# Patient Record
Sex: Female | Born: 1975 | Race: White | Hispanic: Yes | Marital: Single | Smoking: Never smoker
Health system: Southern US, Community
[De-identification: ages and names within clinical notes are randomized; demographics above are authoritative.]

## PROBLEM LIST (undated history)

## (undated) DIAGNOSIS — K219 Gastro-esophageal reflux disease without esophagitis: Secondary | ICD-10-CM

## (undated) DIAGNOSIS — M545 Low back pain, unspecified: Secondary | ICD-10-CM

## (undated) DIAGNOSIS — B159 Hepatitis A without hepatic coma: Secondary | ICD-10-CM

## (undated) DIAGNOSIS — G8929 Other chronic pain: Secondary | ICD-10-CM

## (undated) DIAGNOSIS — I8290 Acute embolism and thrombosis of unspecified vein: Secondary | ICD-10-CM

## (undated) DIAGNOSIS — Z8709 Personal history of other diseases of the respiratory system: Secondary | ICD-10-CM

## (undated) DIAGNOSIS — G43909 Migraine, unspecified, not intractable, without status migrainosus: Secondary | ICD-10-CM

## (undated) DIAGNOSIS — J45909 Unspecified asthma, uncomplicated: Secondary | ICD-10-CM

## (undated) HISTORY — PX: NO PAST SURGERIES: SHX2092

## (undated) HISTORY — DX: Unspecified asthma, uncomplicated: J45.909

---
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
## 2011-11-14 ENCOUNTER — Ambulatory Visit (INDEPENDENT_AMBULATORY_CARE_PROVIDER_SITE_OTHER): Payer: BC Managed Care – PPO | Admitting: Family Medicine

## 2011-11-14 ENCOUNTER — Ambulatory Visit: Payer: BC Managed Care – PPO

## 2011-11-14 DIAGNOSIS — R1110 Vomiting, unspecified: Secondary | ICD-10-CM

## 2011-11-14 DIAGNOSIS — R1084 Generalized abdominal pain: Secondary | ICD-10-CM

## 2011-11-14 DIAGNOSIS — R110 Nausea: Secondary | ICD-10-CM

## 2011-11-14 LAB — POCT CBC
Granulocyte percent: 68.9 %G (ref 37–80)
HCT, POC: 39.4 % (ref 37.7–47.9)
MCHC: 32 g/dL (ref 31.8–35.4)
MPV: 9 fL (ref 0–99.8)
POC Granulocyte: 5.2 (ref 2–6.9)
POC LYMPH PERCENT: 23.6 %L (ref 10–50)
POC MID %: 7.5 %M (ref 0–12)
RDW, POC: 13.5 %

## 2011-11-14 LAB — POCT URINALYSIS DIPSTICK
Bilirubin, UA: NEGATIVE
Glucose, UA: NEGATIVE
Ketones, UA: NEGATIVE
Leukocytes, UA: NEGATIVE
Nitrite, UA: NEGATIVE
pH, UA: 5

## 2011-11-14 LAB — POCT URINE PREGNANCY: Preg Test, Ur: NEGATIVE

## 2011-11-14 LAB — POCT UA - MICROSCOPIC ONLY: Yeast, UA: NEGATIVE

## 2011-11-14 NOTE — Progress Notes (Signed)
Patient Name: [name] Date of Birth: Nov 14, 1975 Medical Record Number: [id number] Gender: female Date of Encounter: 11/14/2011  History of Present Illness:  [name] is a 36 y.o. very pleasant female patient who presents with the following:  [name] has been having waxing and waning abdominal pain and bloating for the last week.  She has actually had these symptoms on occasion over the last couple of years.  The pain tends to occur after eating.  She has noted that milk or very cold foods make her worse.  She has worked on avoiding these foods and seemed to do better- however her symptoms have returned despite these measures recently.   [name] notes that she will have more severe post- prandial pain for 45 minutes to an hour, and then her pain fades away over the next 2 or 3 hours.   Sometimes she will have nausea and vomiting, but vomiting does not resolve her symptoms.  No current constipation or diarrhea.  No fever. No urinary or vaginal symptoms.    LMP- currently No surgical history History of Hep A as a child- around 84 years old.   Her mother had a history of gallstones and had a cholecystectomy in her 30s.   She has no children   There is no problem list on file for this patient.  No past medical history on file. No past surgical history on file. History  Substance Use Topics  . Smoking status: Never Smoker   . Smokeless tobacco: Not on file  . Alcohol Use: Not on file   No family history on file. No Known Allergies  Medication list has been reviewed and updated.  Review of Systems: As per HPI- otherwise negative.  Physical Examination: Filed Vitals:   11/14/11 1237  BP: 107/68  Pulse: 60  Temp: 98.7 F (37.1 C)  TempSrc: Oral  Resp: 16  Height: 5' 2.5" (1.588 m)  Weight: 158 lb (71.668 kg)    Body mass index is 28.44 kg/(m^2).  GEN: WDWN, NAD, Non-toxic, A & O x 3, overweight HEENT: Atraumatic, Normocephalic. Neck  supple. No masses, No LAD.  TM wnl, oropharynx wnl, PEERL Ears and Nose: No external deformity. CV: RRR, No M/G/R. No JVD. No thrill. No extra heart sounds. PULM: CTA B, no wheezes, crackles, rhonchi. No retractions. No resp. distress. No accessory muscle use. ABD: S, NT, ND, +BS. No rebound. No HSM.  Murphy's: no pain, but does note "pressure."  Overall abdominal exam is benign EXTR: No c/c/e NEURO Normal gait.  PSYCH: Normally interactive. Conversant. Not depressed or anxious appearing.  Calm demeanor.   Results for orders placed in visit on 11/14/11  POCT CBC      Component Value Range   WBC 7.5  4.6 - 10.2 (K/uL)   Lymph, poc 1.8  0.6 - 3.4    POC LYMPH PERCENT 23.6  10 - 50 (%L)   MID (cbc) 0.6  0 - 0.9    POC MID % 7.5  0 - 12 (%M)   POC Granulocyte 5.2  2 - 6.9    Granulocyte percent 68.9  37 - 80 (%G)   RBC 4.74  4.04 - 5.48 (M/uL)   Hemoglobin 12.6  12.2 - 16.2 (g/dL)   HCT, POC 03.4  74.2 - 47.9 (%)   MCV 83.2  80 - 97 (fL)   MCH, POC 26.6 (*) 27 - 31.2 (pg)   MCHC 32.0  31.8 - 35.4 (g/dL)   RDW, POC 13.5  Platelet Count, POC 303  142 - 424 (K/uL)   MPV 9.0  0 - 99.8 (fL)  POCT UA - MICROSCOPIC ONLY      Component Value Range   WBC, Ur, HPF, POC 0-2     RBC, urine, microscopic tntc     Bacteria, U Microscopic neg     Mucus, UA neg     Epithelial cells, urine per micros 0-2     Crystals, Ur, HPF, POC neg     Casts, Ur, LPF, POC neg     Yeast, UA neg    POCT URINALYSIS DIPSTICK      Component Value Range   Color, UA amber     Clarity, UA clear     Glucose, UA neg     Bilirubin, UA neg     Ketones, UA neg     Spec Grav, UA 1.020     Blood, UA large     pH, UA 5.0     Protein, UA trace     Urobilinogen, UA 0.2     Nitrite, UA neg     Leukocytes, UA Negative    POCT URINE PREGNANCY      Component Value Range   Preg Test, Ur Negative     [facility] reading (PRIMARY) by  Dr. Patsy Lager. Negative for obstructive pattern  ABDOMEN - 1 VIEW  Comparison:  None.  Findings: No dilated loops of large or small bowel. No pathologic calcifications. Rounded vascular calcification in left lower pelvis. No acute abnormality.  IMPRESSION: Normal abdominal radiograph.  Clinically significant discrepancy from primary report, if provided: None  Assessment and Plan: 1. Abdominal pain  POCT CBC, POCT UA - Microscopic Only, POCT urinalysis dipstick, POCT urine pregnancy, Comprehensive metabolic panel, Amylase, Lipase, DG Abd 1 View, US Abdomen Complete  2. Nausea  US Abdomen Complete  3. Vomiting  US Abdomen Complete   Suspect that [name] is having gallbladder colic.  Will arrange for her to have an ultrasound in the next couple of days.  If she gets worse please call or seek care right away.  Avoid fatty foods

## 2011-11-15 ENCOUNTER — Ambulatory Visit (INDEPENDENT_AMBULATORY_CARE_PROVIDER_SITE_OTHER): Payer: BC Managed Care – PPO | Admitting: Family Medicine

## 2011-11-15 ENCOUNTER — Encounter (HOSPITAL_COMMUNITY): Payer: Self-pay | Admitting: General Practice

## 2011-11-15 ENCOUNTER — Inpatient Hospital Stay (HOSPITAL_COMMUNITY)
Admission: AD | Admit: 2011-11-15 | Discharge: 2011-11-19 | DRG: 493 | Disposition: A | Discharge status: Home / Self Care | Payer: BC Managed Care – PPO | Source: Ambulatory Visit | Attending: Family Medicine | Admitting: Family Medicine

## 2011-11-15 ENCOUNTER — Ambulatory Visit
Admission: RE | Admit: 2011-11-15 | Discharge: 2011-11-15 | Disposition: A | Discharge status: Home / Self Care | Payer: BC Managed Care – PPO | Source: Ambulatory Visit | Attending: Family Medicine | Admitting: Family Medicine

## 2011-11-15 ENCOUNTER — Encounter: Payer: Self-pay | Admitting: Family Medicine

## 2011-11-15 VITALS — BP 141/85 | HR 77 | Temp 98.8°F | Resp 16 | Ht 61.5 in | Wt 158.0 lb

## 2011-11-15 DIAGNOSIS — R109 Unspecified abdominal pain: Secondary | ICD-10-CM

## 2011-11-15 DIAGNOSIS — J45909 Unspecified asthma, uncomplicated: Secondary | ICD-10-CM | POA: Diagnosis present

## 2011-11-15 DIAGNOSIS — R112 Nausea with vomiting, unspecified: Secondary | ICD-10-CM

## 2011-11-15 DIAGNOSIS — K8021 Calculus of gallbladder without cholecystitis with obstruction: Secondary | ICD-10-CM

## 2011-11-15 DIAGNOSIS — R1110 Vomiting, unspecified: Secondary | ICD-10-CM

## 2011-11-15 DIAGNOSIS — R110 Nausea: Secondary | ICD-10-CM

## 2011-11-15 DIAGNOSIS — R1084 Generalized abdominal pain: Secondary | ICD-10-CM

## 2011-11-15 DIAGNOSIS — E876 Hypokalemia: Secondary | ICD-10-CM | POA: Diagnosis present

## 2011-11-15 DIAGNOSIS — K219 Gastro-esophageal reflux disease without esophagitis: Secondary | ICD-10-CM | POA: Diagnosis present

## 2011-11-15 DIAGNOSIS — Z9049 Acquired absence of other specified parts of digestive tract: Secondary | ICD-10-CM

## 2011-11-15 DIAGNOSIS — K810 Acute cholecystitis: Secondary | ICD-10-CM

## 2011-11-15 DIAGNOSIS — K8070 Calculus of gallbladder and bile duct without cholecystitis without obstruction: Principal | ICD-10-CM | POA: Diagnosis present

## 2011-11-15 DIAGNOSIS — B159 Hepatitis A without hepatic coma: Secondary | ICD-10-CM

## 2011-11-15 DIAGNOSIS — R1011 Right upper quadrant pain: Secondary | ICD-10-CM

## 2011-11-15 HISTORY — DX: Acute embolism and thrombosis of unspecified vein: I82.90

## 2011-11-15 HISTORY — DX: Gastro-esophageal reflux disease without esophagitis: K21.9

## 2011-11-15 HISTORY — DX: Other chronic pain: G89.29

## 2011-11-15 HISTORY — DX: Migraine, unspecified, not intractable, without status migrainosus: G43.909

## 2011-11-15 HISTORY — DX: Personal history of other diseases of the respiratory system: Z87.09

## 2011-11-15 HISTORY — DX: Low back pain, unspecified: M54.50

## 2011-11-15 HISTORY — DX: Low back pain: M54.5

## 2011-11-15 HISTORY — DX: Hepatitis a without hepatic coma: B15.9

## 2011-11-15 LAB — COMPREHENSIVE METABOLIC PANEL
ALT: 496 U/L — ABNORMAL HIGH (ref 0–35)
AST: 479 U/L — ABNORMAL HIGH (ref 0–37)
Albumin: 4.8 g/dL (ref 3.5–5.2)
Alkaline Phosphatase: 316 U/L — ABNORMAL HIGH (ref 39–117)
BUN: 17 mg/dL (ref 6–23)
Calcium: 9.1 mg/dL (ref 8.4–10.5)
Chloride: 106 mEq/L (ref 96–112)
Potassium: 4.4 mEq/L (ref 3.5–5.3)
Sodium: 142 mEq/L (ref 135–145)
Total Protein: 8.2 g/dL (ref 6.0–8.3)

## 2011-11-15 LAB — CBC
HCT: 36.6 % (ref 36.0–46.0)
MCV: 81.5 fL (ref 78.0–100.0)
RBC: 4.49 MIL/uL (ref 3.87–5.11)
RDW: 13.3 % (ref 11.5–15.5)
WBC: 11 10*3/uL — ABNORMAL HIGH (ref 4.0–10.5)

## 2011-11-15 LAB — CREATININE, SERUM
GFR calc Af Amer: >90 mL/min (ref >90)
GFR calc non Af Amer: >90 mL/min (ref >90)

## 2011-11-15 MED ORDER — MORPHINE SULFATE 2 MG/ML IJ SOLN
2.0000 mg | INTRAMUSCULAR | Status: DC | PRN
  Administered 2011-11-15: 2 mg via INTRAVENOUS
  Filled 2011-11-15 (×2): qty 1

## 2011-11-15 MED ORDER — DEXTROSE-NACL 5-0.45 % IV SOLN
INTRAVENOUS | Status: DC
  Administered 2011-11-15 – 2011-11-17 (×4): via INTRAVENOUS

## 2011-11-15 MED ORDER — HYDROMORPHONE HCL 2 MG PO TABS
2.0000 mg | ORAL_TABLET | Freq: Once | ORAL | Status: AC
  Administered 2011-11-15: 2 mg via ORAL
  Filled 2011-11-15: qty 1

## 2011-11-15 MED ORDER — ONDANSETRON HCL 4 MG/2ML IJ SOLN
4.0000 mg | Freq: Four times a day (QID) | INTRAMUSCULAR | Status: DC | PRN
  Filled 2011-11-15: qty 2

## 2011-11-15 MED ORDER — ENOXAPARIN SODIUM 40 MG/0.4ML ~~LOC~~ SOLN
40.0000 mg | SUBCUTANEOUS | Status: DC
  Administered 2011-11-15: 40 mg via SUBCUTANEOUS
  Filled 2011-11-15 (×2): qty 0.4

## 2011-11-15 NOTE — Progress Notes (Signed)
  Patient Name: [name] Date of Birth: 06-06-1976 Medical Record Number: [id number] Gender: female Date of Encounter: 11/15/2011  History of Present Illness:  [name] is a 36 y.o. very pleasant female patient who presents with the following:  Here to recheck her condition and be evaluated for possible direct admission to [facility] for management of a likely CBD stone.  See recent ultrasound result and elevated LFTs.  [name] is currently having pain again for about an hour, but this is the same pain she has been having through the course of her illness.  She has vomited once about an hour after we called her to let her know that she probably needed admission.  She was not sure if this was due to pain or nerves.  She had been able to eat earlier today and has no fever  There is no problem list on file for this patient.  Past Medical History  Diagnosis Date  . Reactive airway disease    No past surgical history on file. History  Substance Use Topics  . Smoking status: Never Smoker   . Smokeless tobacco: Never Used  . Alcohol Use: Yes     occasinallly    Family History  Problem Relation Age of Onset  . Hypertension Mother   . Diabetes Maternal Grandfather    No Known Allergies  Medication list has been reviewed and updated.  Review of Systems: As per HPI- otherwise negative.  Physical Examination: Filed Vitals:   11/15/11 1908  BP: 141/85  Pulse: 77  Temp: 98.8 F (37.1 C)  TempSrc: Oral  Resp: 16  Height: 5' 1.5" (1.562 m)  Weight: 158 lb (71.668 kg)    Body mass index is 29.37 kg/(m^2).  GEN: WDWN, NAD, Non-toxic, A & O x 3 HEENT: Atraumatic, Normocephalic. Neck supple. No masses, No LAD. Ears and Nose: No external deformity. CV: RRR, No M/G/R. No JVD. No thrill. No extra heart sounds. PULM: CTA B, no wheezes, crackles, rhonchi. No retractions. No resp. distress. No accessory muscle use. ABD:  ND, +BS. No rebound. No HSM.  RUQ tenderness,  positive murphy's sign EXTR: No c/c/e NEURO Normal gait.  PSYCH: Normally interactive. Conversant. Not depressed or anxious appearing.  Calm demeanor but appears uncomfortable   Assessment and Plan: 1. Gallstones with biliary obstruction    [name] likely has a CBD stone.  Will admit for supportive care and further treatment (ERCP, cholecystectomy as deemed appropriate).  Appreciate care of FP inpatient team.  Her BF is here to drive her to the hospital

## 2011-11-15 NOTE — H&P (Incomplete)
**Note Peggy-Identified via Obfuscation** [name] is an 36 y.o. female.    Chief Complaint: ***  HPI: ***  Past Medical History  Diagnosis Date  . Reactive airway disease     No past surgical history on file.  Family History  Problem Relation Age of Onset  . Hypertension Mother   . Diabetes Maternal Grandfather    Social History:  reports that she has never smoked. She has never used smokeless tobacco. She reports that she drinks alcohol. She reports that she does not use illicit drugs.  Allergies: No Known Allergies   Results for orders placed in visit on 11/14/11 (from the past 48 hour(s))  COMPREHENSIVE METABOLIC PANEL     Status: Abnormal   Collection Time   11/14/11  1:43 PM      Component Value Range Comment   Sodium 142  135 - 145 (mEq/L)    Potassium 4.4  3.5 - 5.3 (mEq/L)    Chloride 106  96 - 112 (mEq/L)    CO2 26  19 - 32 (mEq/L)    Glucose, Bld 97  70 - 99 (mg/dL)    BUN 17  6 - 23 (mg/dL)    Creat 1.61  0.96 - 1.10 (mg/dL)    Total Bilirubin 0.8  0.3 - 1.2 (mg/dL)    Alkaline Phosphatase 316 (*) 39 - 117 (U/L) Result repeated and verified.   AST 479 (*) 0 - 37 (U/L) Result repeated and verified.   ALT 496 (*) 0 - 35 (U/L)    Total Protein 8.2  6.0 - 8.3 (g/dL)    Albumin 4.8  3.5 - 5.2 (g/dL)    Calcium 9.1  8.4 - 10.5 (mg/dL)   AMYLASE     Status: Normal   Collection Time   11/14/11  1:43 PM      Component Value Range Comment   Amylase 47  0 - 105 (U/L)   LIPASE     Status: Normal   Collection Time   11/14/11  1:43 PM      Component Value Range Comment   Lipase 40  0 - 75 (U/L)   POCT CBC     Status: Abnormal   Collection Time   11/14/11  1:51 PM      Component Value Range Comment   WBC 7.5  4.6 - 10.2 (K/uL)    Lymph, poc 1.8  0.6 - 3.4     POC LYMPH PERCENT 23.6  10 - 50 (%L)    MID (cbc) 0.6  0 - 0.9     POC MID % 7.5  0 - 12 (%M)    POC Granulocyte 5.2  2 - 6.9     Granulocyte percent 68.9  37 - 80 (%G)    RBC 4.74  4.04 - 5.48 (M/uL)    Hemoglobin 12.6  12.2 - 16.2  (g/dL)    HCT, POC 04.5  40.9 - 47.9 (%)    MCV 83.2  80 - 97 (fL)    MCH, POC 26.6 (*) 27 - 31.2 (pg)    MCHC 32.0  31.8 - 35.4 (g/dL)    RDW, POC 81.1      Platelet Count, POC 303  142 - 424 (K/uL)    MPV 9.0  0 - 99.8 (fL)   POCT UA - MICROSCOPIC ONLY     Status: Normal   Collection Time   11/14/11  1:51 PM      Component Value Range Comment   WBC, Ur, HPF, POC  0-2      RBC, urine, microscopic tntc      Bacteria, U Microscopic neg      Mucus, UA neg      Epithelial cells, urine per micros 0-2      Crystals, Ur, HPF, POC neg      Casts, Ur, LPF, POC neg      Yeast, UA neg     POCT URINALYSIS DIPSTICK     Status: Normal   Collection Time   11/14/11  1:51 PM      Component Value Range Comment   Color, UA amber      Clarity, UA clear      Glucose, UA neg      Bilirubin, UA neg      Ketones, UA neg      Spec Grav, UA 1.020      Blood, UA large      pH, UA 5.0      Protein, UA trace      Urobilinogen, UA 0.2      Nitrite, UA neg      Leukocytes, UA Negative     POCT URINE PREGNANCY     Status: Normal   Collection Time   11/14/11  1:51 PM      Component Value Range Comment   Preg Test, Ur Negative      Dg Abd 1 View  11/14/2011  *RADIOLOGY REPORT*  Clinical Data: Epigastric pain  ABDOMEN - 1 VIEW.  IMPRESSION: Normal abdominal radiograph.  Clinically significant discrepancy from primary report, if provided: None  Original Report Authenticated By: Genevive Bi, M.D.   US Abdomen Complete  11/15/2011  *RADIOLOGY REPORT*  Clinical Data:  Epigastric pain, nausea, vomiting and bloating  COMPLETE ABDOMINAL ULTRASOUND  IMPRESSION:  1.  Multiple gallstones,  the largest measuring 2.5 cm.  No present evidence of acute cholecystitis is noted by ultrasound. 2.  Slightly prominent common bile duct.  Cannot exclude distal common bile duct calculus.  Original Report Authenticated By: Juline Patch, M.D.    ROS  There were no vitals taken for this visit. Physical Exam    Assessment/Plan # Choledocholithiasis: Abdominal US concerning for distal common bile duct calculus.  Patient complains of abdominal pain and intermittent nausea/vomiting, but denies any fever, chills. - Admit to FPTS - Make NPO after MD, MIVF D5 1/2 NS @ 150 cc/hr - On call GI physician aware of patient, GI plans to perform ERCP tomorrow AM  #  [name],[name] 11/15/2011, 8:50 PM

## 2011-11-15 NOTE — H&P (Signed)
[name] is an 36 y.o. female.    Chief Complaint: abdominal pain, nausea, vomiting  HPI:   Patient was seen at [facility] today for worsening abdominal pain.  Pain is located mid-epigastric area and radiates to RUQ, LUQ, and upper back.   Patient has had chronic abdominal pain that has been on and off for about one year.  Last weekend, she developed worsening mid-epigastric pain that was worse after drinking alcohol, milk, and very cold foods.  Yesterday, she developed pain, nausea, and vomiting and went to Urgent Care.  Physician ordered an abdominal US which showed dilated CBD and possible stone, and patient returned to [place] today for possible direct admission.  Today, patient complains of worsening pain and was unable to keep down any food.  Denies fevers, chills, GU symptoms or diarrhea.    Past Medical History  Diagnosis Date  . Reactive airway disease, Dx Hepatitis A when she was 36 years old    Past Surgical Hx: None  Family History  Problem Relation Age of Onset  . Hypertension Mother   . Diabetes Maternal Grandfather    Social History:  reports that she has never smoked. She has never used smokeless tobacco. She reports that she drinks alcohol on occasion, one glass on the weekends.  She reports that she does not use illicit drugs.  Allergies: No Known Allergies           Component Value Range   Sodium 142  135 - 145 (mEq/L)   Potassium 4.4  3.5 - 5.3 (mEq/L)   Chloride 106  96 - 112 (mEq/L)   CO2 26  19 - 32 (mEq/L)   Glucose, Bld 97  70 - 99 (mg/dL)   BUN 17  6 - 23 (mg/dL)   Creat 1.61  0.96 - 0.45 (mg/dL)   Total Bilirubin 0.8  0.3 - 1.2 (mg/dL)   Alkaline Phosphatase 316 (*) 39 - 117 (U/L)   AST 479 (*) 0 - 37 (U/L)   ALT 496 (*) 0 - 35 (U/L)   Total Protein 8.2  6.0 - 8.3 (g/dL)   Albumin 4.8  3.5 - 5.2 (g/dL)   Calcium 9.1  8.4 - 40.9 (mg/dL)          Component Value Range   WBC 7.5  4.6 - 10.2 (K/uL)   Lymph, poc 1.8  0.6 - 3.4      POC LYMPH PERCENT 23.6  10 - 50 (%L)   MID (cbc) 0.6  0 - 0.9    POC MID % 7.5  0 - 12 (%M)   POC Granulocyte 5.2  2 - 6.9    Granulocyte percent 68.9  37 - 80 (%G)   RBC 4.74  4.04 - 5.48 (M/uL)   Hemoglobin 12.6  12.2 - 16.2 (g/dL)   HCT, POC 81.1  91.4 - 47.9 (%)   MCV 83.2  80 - 97 (fL)   MCH, POC 26.6 (*) 27 - 31.2 (pg)   MCHC 32.0  31.8 - 35.4 (g/dL)   RDW, POC 78.2     Platelet Count, POC 303  142 - 424 (K/uL)   MPV 9.0  0 - 99.8 (fL)          Component Value Range   WBC, Ur, HPF, POC 0-2     RBC, urine, microscopic tntc     Bacteria, U Microscopic neg     Mucus, UA neg     Epithelial cells, urine per micros  0-2     Crystals, Ur, HPF, POC neg     Casts, Ur, LPF, POC neg     Yeast, UA neg            Component Value Range   Color, UA amber     Clarity, UA clear     Glucose, UA neg     Bilirubin, UA neg     Ketones, UA neg     Spec Grav, UA 1.020     Blood, UA large     pH, UA 5.0     Protein, UA trace     Urobilinogen, UA 0.2     Nitrite, UA neg     Leukocytes, UA Negative            Component Value Range   Preg Test, Ur Negative     Dg Abd 1 View  11/14/2011  *RADIOLOGY REPORT*  Clinical Data: Epigastric pain  ABDOMEN - 1 VIEW.  IMPRESSION: Normal abdominal radiograph.  Clinically significant discrepancy from primary report, if provided: None  Original Report Authenticated By: Genevive Bi, M.D.   US Abdomen Complete  11/15/2011  *RADIOLOGY REPORT*  Clinical Data:  Epigastric pain, nausea, vomiting and bloating  COMPLETE ABDOMINAL ULTRASOUND  IMPRESSION:  1.  Multiple gallstones,  the largest measuring 2.5 cm.  No present evidence of acute cholecystitis is noted by ultrasound. 2.  Slightly prominent common bile duct.  Cannot exclude distal common bile duct calculus.  Original Report Authenticated By: Juline Patch, M.D.   Lipase     Component Value Date/Time   LIPASE 40 11/14/2011 1343    Amylase    Component Value Date/Time   AMYLASE 47 11/14/2011  1343    ROS  Per HPI  Blood pressure 129/81, pulse 69, temperature 98.2 F (36.8 C), temperature source Oral, resp. rate 14, weight 198 lb 10.2 oz (90.1 kg), SpO2 100.00%. Physical Exam  Constitutional:       Mild distress due to pain, she is very pleasant, but very uncomfortable  HENT:  Head: Normocephalic and atraumatic.  Neck: Normal range of motion. Neck supple.  Cardiovascular: Normal rate, regular rhythm and normal heart sounds.   No murmur heard. Respiratory: Effort normal and breath sounds normal. No respiratory distress. She has no wheezes. She has no rales.  GI: Soft.       Non-distended, diminished BS, + RUQ tenderness, no rebound or guarding  Musculoskeletal: Normal range of motion. She exhibits no edema and no tenderness.  Neurological: She is alert.  Skin: Skin is warm. No rash noted.    Assessment/Plan # Choledocholithiasis: Abdominal US concerning for distal common bile duct calculus.  Patient complains of abdominal pain and intermittent nausea/vomiting, but denies any fever, chills. - Admit to FPTS - Make NPO after MD, MIVF D5 1/2 NS @ 150 cc/hr - Case discussed on call GI physician, GI will see patient and likely perform ERCP or MRCP in AM.  Thank you for you assistance. - Follow up CBC, CMET, amylase, lipase in AM  # Abdominal Pain/Nausea/Vomiting: on arrival, patient given Dilaudid 2 mg PO once - Morphine 2 mg q 3 PRN pain - Zofran 4 mg q 6 PRN nausea - MIVF  # Seasonal allergies: stable - Hold PO medications until seen by GI  # FEN/GI: - See above assessment  # DVT PPX: Lovenox SQ daily  # Disposition: pending clinical improvement and GI recommendations.     [name],Yony Roulston 11/15/2011, 9:37 PM

## 2011-11-15 NOTE — Patient Instructions (Signed)
Please go to [facility].  Check in at the ED but DO NOT be seen there.  Your bed # is 5524, and you are being admitted to the family practice service.

## 2011-11-16 ENCOUNTER — Encounter (HOSPITAL_COMMUNITY): Payer: Self-pay | Admitting: Physician Assistant

## 2011-11-16 ENCOUNTER — Other Ambulatory Visit: Payer: Self-pay

## 2011-11-16 DIAGNOSIS — Z9049 Acquired absence of other specified parts of digestive tract: Secondary | ICD-10-CM

## 2011-11-16 DIAGNOSIS — R7402 Elevation of levels of lactic acid dehydrogenase (LDH): Secondary | ICD-10-CM | POA: Diagnosis present

## 2011-11-16 DIAGNOSIS — R112 Nausea with vomiting, unspecified: Secondary | ICD-10-CM

## 2011-11-16 DIAGNOSIS — R7401 Elevation of levels of liver transaminase levels: Secondary | ICD-10-CM

## 2011-11-16 DIAGNOSIS — R1013 Epigastric pain: Secondary | ICD-10-CM

## 2011-11-16 DIAGNOSIS — K8070 Calculus of gallbladder and bile duct without cholecystitis without obstruction: Principal | ICD-10-CM

## 2011-11-16 LAB — COMPREHENSIVE METABOLIC PANEL
ALT: 304 U/L — ABNORMAL HIGH (ref 0–35)
AST: 118 U/L — ABNORMAL HIGH (ref 0–37)
Albumin: 3.5 g/dL (ref 3.5–5.2)
CO2: 25 mEq/L (ref 19–32)
Calcium: 8.6 mg/dL (ref 8.4–10.5)
Chloride: 102 mEq/L (ref 96–112)
Creatinine, Ser: 0.62 mg/dL (ref 0.50–1.10)
GFR calc non Af Amer: >90 mL/min (ref >90)
Sodium: 137 mEq/L (ref 135–145)
Total Bilirubin: 0.7 mg/dL (ref 0.3–1.2)

## 2011-11-16 LAB — CBC
Hemoglobin: 11.3 g/dL — ABNORMAL LOW (ref 12.0–15.0)
MCH: 27.4 pg (ref 26.0–34.0)
MCHC: 33.1 g/dL (ref 30.0–36.0)
MCV: 82.8 fL (ref 78.0–100.0)
RBC: 4.12 MIL/uL (ref 3.87–5.11)

## 2011-11-16 LAB — LIPASE, BLOOD: Lipase: 46 U/L (ref 11–59)

## 2011-11-16 MED ORDER — GLYCERIN (LAXATIVE) 2.1 G RE SUPP
1.0000 | Freq: Once | RECTAL | Status: AC
  Administered 2011-11-16: 1 via RECTAL
  Filled 2011-11-16: qty 1

## 2011-11-16 MED ORDER — ENOXAPARIN SODIUM 40 MG/0.4ML ~~LOC~~ SOLN
40.0000 mg | SUBCUTANEOUS | Status: DC
  Administered 2011-11-16: 40 mg via SUBCUTANEOUS
  Filled 2011-11-16 (×2): qty 0.4

## 2011-11-16 MED ORDER — POTASSIUM CHLORIDE 20 MEQ/15ML (10%) PO LIQD
40.0000 meq | Freq: Once | ORAL | Status: AC
  Administered 2011-11-16: 40 meq via ORAL
  Filled 2011-11-16: qty 30

## 2011-11-16 MED ORDER — POTASSIUM CHLORIDE 10 MEQ/100ML IV SOLN
10.0000 meq | INTRAVENOUS | Status: DC
  Administered 2011-11-16: 10 meq via INTRAVENOUS
  Filled 2011-11-16 (×4): qty 100

## 2011-11-16 NOTE — Progress Notes (Signed)
Family Medicine Teaching Service Daily Progress Note  Patient ID: [name], female   DOB: Oct 02, 1975, 36 y.o.   MRN: [id number] Subjective: Interval History: No overnight issues.  Slept comfortably.  Still feels some discomfort in her upper stomach, but says she feels much better. Denies any other symptoms.  Objective: Vital signs in last 24 hours: Temp:  [97.7 F (36.5 C)-98.8 F (37.1 C)] 97.7 F (36.5 C) (05/03 1914) Pulse Rate:  [56-77] 56  (05/03 0633) Resp:  [14-16] 16  (05/03 0633) BP: (99-141)/(67-85) 99/67 mmHg (05/03 0633) SpO2:  [99 %-100 %] 99 % (05/03 7829) Weight:  [158 lb (71.668 kg)-198 lb 10.2 oz (90.1 kg)] 198 lb 10.2 oz (90.1 kg) (05/02 2129)  Physical Exam  Constitutional: She is oriented to person, place, and time and well-developed, well-nourished, and in no distress.  HENT:  Head: Normocephalic.  Eyes: EOM are normal.  Neck: Normal range of motion.  Cardiovascular: Normal rate, regular rhythm and normal heart sounds.  Exam reveals no gallop and no friction rub.   No murmur heard. Pulmonary/Chest: Effort normal and breath sounds normal. No respiratory distress. She has no wheezes.  Abdominal: Soft. Bowel sounds are normal. She exhibits no mass. There is tenderness. There is no guarding.       Normoactive bowel sounds can be heard throughout. Mild tenderness over right upper quadrant and epigastric area.  Musculoskeletal: Normal range of motion.  Neurological: She is alert and oriented to person, place, and time.  Skin: Skin is warm.   . CMP     Component Value Date/Time   NA 137 11/16/2011 0606   K 3.1* 11/16/2011 0606   CL 102 11/16/2011 0606   CO2 25 11/16/2011 0606   GLUCOSE 118* 11/16/2011 0606   BUN 13 11/16/2011 0606   CREATININE 0.62 11/16/2011 0606   CREATININE 0.66 11/14/2011 1343   CALCIUM 8.6 11/16/2011 0606   PROT 6.8 11/16/2011 0606   ALBUMIN 3.5 11/16/2011 0606   AST 118* 11/16/2011 0606   ALT 304* 11/16/2011 0606   ALKPHOS 292* 11/16/2011 0606     BILITOT 0.7 11/16/2011 0606   GFRNONAA >90 11/16/2011 0606   GFRAA >90 11/16/2011 0606   BMET    Component Value Date/Time   NA 137 11/16/2011 0606   K 3.1* 11/16/2011 0606   CL 102 11/16/2011 0606   CO2 25 11/16/2011 0606   GLUCOSE 118* 11/16/2011 0606   BUN 13 11/16/2011 0606   CREATININE 0.62 11/16/2011 0606   CREATININE 0.66 11/14/2011 1343   CALCIUM 8.6 11/16/2011 0606   GFRNONAA >90 11/16/2011 0606   GFRAA >90 11/16/2011 0606       Studies/Results: Dg Abd 1 View  11/14/2011  *RADIOLOGY REPORT*  Clinical Data: Epigastric pain  ABDOMEN - 1 VIEW  Comparison: None.  Findings: No dilated loops of large or small bowel.  No pathologic calcifications. Rounded vascular calcification in left lower pelvis.  No acute abnormality.  IMPRESSION: Normal abdominal radiograph.  Clinically significant discrepancy from primary report, if provided: None  Original Report Authenticated By: Genevive Bi, M.D.   US Abdomen Complete  11/15/2011  *RADIOLOGY REPORT*  Clinical Data:  Epigastric pain, nausea, vomiting and bloating  COMPLETE ABDOMINAL ULTRASOUND  Comparison:  Abdomen film of 11/14/2011  Findings:  Gallbladder:  The gallbladder is visualized and multiple gallstones and gallbladder sludge are present.  The largest gallstone measures 2.5 cm in maximum diameter.  There is no pain over the gallbladder with compression currently.  Common bile  duct:  The common bile duct measures up to 7.7 cm in diameter, and a distal common bile duct calculus cannot be excluded.  Correlation with liver function tests is recommended.  Liver:  The liver has a normal echogenic pattern.  No ductal dilatation is seen.  IVC:  Appears normal.  Pancreas:  No focal abnormality seen.  Spleen:  The spleen is normal measuring 4.3 cm sagittally.  Right Kidney:  No hydronephrosis is seen.  The right kidney measures 10.5 cm sagittally.  Left Kidney:  No hydronephrosis is noted.  The left kidney measures 10.7 cm.  Abdominal aorta:  The abdominal aorta is  normal in caliber.  IMPRESSION:  1.  Multiple gallstones,  the largest measuring 2.5 cm.  No present evidence of acute cholecystitis is noted by ultrasound. 2.  Slightly prominent common bile duct.  Cannot exclude distal common bile duct calculus.  Original Report Authenticated By: Juline Patch, M.D.    Scheduled Meds:   . enoxaparin  40 mg Subcutaneous Q24H  . HYDROmorphone  2 mg Oral Once   Continuous Infusions:   . dextrose 5 % and 0.45% NaCl 150 mL/hr at 11/16/11 0515   PRN Meds:morphine injection, ondansetron (ZOFRAN) IV  Assessment/Plan:  1. Choledocholithiasis: Abdominal US concerning for distal common bile duct calculus. Patient complains of abdominal pain and intermittent nausea/vomiting, but denies any fever, chills. Labs (5/3): ALT/AST trend 496/479->304/118   Alk Phos 316->292   K+ 4.4->3.1  WBCs 7.5->11.0->6.1 - Currently NPO since MD, MIVF D5 1/2 NS @ 150 cc/hr  - GI is consulted and will see patient today and for eval for ERCP or MRCP, vs. Surgery consult for cholecystectomy.  - Pain mgt: Continue Morphine 2 mg q 3 PRN pain - Zofran 4 mg q 6 PRN nausea  - K+ decreased, give IV Potassium runs  3. Seasonal allergies: stable  - Hold PO medications until seen by GI   4. FEN/GI:  - See above assessment   5. DVT PPX: Lovenox SQ daily   6.Disposition: pending clinical improvement and GI recommendations.     LOS: 1 day   Scaglione, Lauren  PGY2 Addendum to student Progress Note: I have seen and examined the patient and agree with student's note with the above highlighted  changes/corrections.   Amelio Brosky 11/16/2011 11:51 AM

## 2011-11-16 NOTE — Progress Notes (Signed)
Clinical Social Work Department BRIEF PSYCHOSOCIAL ASSESSMENT 11/16/2011  Patient:  [name], [name]     Account Number:  [id number]     Admit date:  11/15/2011  Clinical Social Worker:  Lourdes Sledge  Date/Time:  11/16/2011 11:48 AM  Referred by:  Physician  Date Referred:  11/16/2011 Referred for  Advanced Directives   Other Referral:   Interview type:  Patient Other interview type:    PSYCHOSOCIAL DATA Living Status:  OTHER Admitted from facility:   Level of care:   Primary support name:  Feliberto Harts Primary support relationship to patient:  FRIEND Degree of support available:   During admission pt reported living with a roomate and having support friends and family. Pt will dc back to her home with a friend.    CURRENT CONCERNS Current Concerns  Other - See comment   Other Concerns:   Advanced Directives    SOCIAL WORK ASSESSMENT / PLAN CSW received referral for advanced directives however pt stated she was not interested in receiving the packet or information about it. Pt did not have any additional CSW needs. CSW signed off.   Assessment/plan status:  No Further Intervention Required Other assessment/ plan:   Information/referral to community resources:    PATIENT'S/FAMILY'S RESPONSE TO PLAN OF CARE: Pt was alert and oriented. Pt declined wanting an advanced directive packet. Referral was inappropriate. CSW signed off.        Theresia Bough, MSW, Theresia Majors [phone]

## 2011-11-16 NOTE — Consult Note (Signed)
[facility] Consult: 9:33 AM 11/16/2011   Referring Provider: Dr  Tye Savoy, resident.  Primary Care Physician:  Abbe Amsterdam, MD, MD Primary Gastroenterologist:  None, new to Dr Juanda Chance   Reason for Consultation:  Choledocholithiasis?  HPI: [name] is a 36 y.o. female. Hx of asthma and Heptitis A at age 2.  Admitted 11/15/11 in PM.  Since 4/27 has had intermittent bil upper abdominal pain and nausea/vomitting.  It intensified yesterday to 9/10 and she had vomitting. Her Alk Phos, AST and ALT elevated.  Bilirubin is normal.  Ultrasound with gallstones, and 7.7 mm CBD but no stones in CBD.  Radiologist states "can not exclude distal common bile duct calculus".   For at least a year has had lesser incidents of the same sxs. She says at times sxs are post prandial but rarely has associated n/v Received only one dose of 2 mg morphine and 2 mg of Dilaudid from 2100 to 2200 yesterday and pain has improved.  She has had no vomitting, nor received anti-emetics.  No fevers, chills, clay colored stools, tea-colored ureine, pruritus, jaundice. Endorses anorexia though presently she is hungry and NPO.  Did not take any meds at home to treat sxs.  She drinks about 2 glasses of wine twice a month.     Past Medical History  Diagnosis Date  . History of bronchitis     "multiple times"  . Reactive airway disease   . Asthma   . GERD (gastroesophageal reflux disease)   . Hepatitis A 1986  . Migraines     "not often"  . Kidney stones   . Chronic lower back pain     Past Surgical History  Procedure Date  . No past surgeries     Prior to Admission medications   Medication Sig Start Date End Date Taking? Authorizing Provider  fexofenadine (ALLEGRA) 180 MG tablet Take 180 mg by mouth daily.   Yes Historical Provider, MD    Scheduled Meds:    . enoxaparin  40 mg Subcutaneous Q24H  . HYDROmorphone  2 mg Oral Once   Infusions:    .  dextrose 5 % and 0.45% NaCl 150 mL/hr at 11/16/11 0515   PRN Meds: morphine injection, ondansetron (ZOFRAN) IV   Allergies as of 11/15/2011  . (No Known Allergies)    Family History  Problem Relation Age of Onset  . Hypertension Mother   . Diabetes Maternal Grandfather     History   Social History  . Marital Status: Unknown    Spouse Name: N/A    Number of Children: N/A  . Years of Education: N/A   Occupational History  . Not on file.   Social History Main Topics  . Smoking status: Never Smoker   . Smokeless tobacco: Never Used  . Alcohol Use: 0.6 oz/week    1 Glasses of wine per week  . Drug Use: No  . Sexually Active: No   Other Topics Concern  . Not on file   Social History Narrative  . No narrative on file    REVIEW OF SYSTEMS: Constitutional:  No weight fluctuation.  No weakness ENT:  No nose bleeds.  some Pulm:  Occasional cough CV:  No palps or chest pressure GU:  No dysuria or tea-like urine GI:  Above.  Rarely if ever gets heartburn Heme:  No anemia.  .    Transfusions:  none Neuro:  Occasional headaches Derm:  No rashes, sores.  A few months ago  was having pruritus but it resolved Endocrine: no excessive thirst or urination.  Gyn:  Has never had pap smear or pelvic exam.  Periods are regular Immunization:  Did not inquire  PHYSICAL EXAM: Vital signs in last 24 hours: Temp:  [97.7 F (36.5 C)-98.8 F (37.1 C)] 97.7 F (36.5 C) (05/03 4132) Pulse Rate:  [56-77] 56  (05/03 0633) Resp:  [14-16] 16  (05/03 0633) BP: (99-141)/(67-85) 99/67 mmHg (05/03 0633) SpO2:  [99 %-100 %] 99 % (05/03 4401) Weight:  [158 lb (71.668 kg)-198 lb 10.2 oz (90.1 kg)] 198 lb 10.2 oz (90.1 kg) (05/02 2129)  General: Looks well and healthy, white female with Spanish accent. Head:  No assymmetry.  No signs of trauma  Eyes:  No icterus or conj pallor Ears:  Not HOH  Nose:  No congestion or bleeding Mouth:  Moist, clear, good teeth. Neck:  No masses, TMG,  bruits Lungs:  Clear.  No SOB or cough. Heart: RRR.  No MRG Abdomen:  Soft, NT, ND, no masses or HSM.   Rectal: not performed   Musc/Skeltl: no joint deformity Extremities:  No pedal edema  Neurologic:  Fully alert and oriented.  Moves all 4s. Skin:  No jaundice or rash Tattoos:  None seen Nodes:  No adenopathy at neck or groin   Psych:  Pleasant.  Not depressed or agitated.   LAB RESULTS:  Basename 11/16/11 0606 11/15/11 2147 11/14/11 1351  WBC 6.1 11.0* 7.5  HGB 11.3* 12.1 12.6  HCT 34.1* 36.6 39.4  PLT 227 261 --   BMET Lab Results  Component Value Date   NA 137 11/16/2011   NA 142 11/14/2011   K 3.1* 11/16/2011   K 4.4 11/14/2011   CL 102 11/16/2011   CL 106 11/14/2011   CO2 25 11/16/2011   CO2 26 11/14/2011   GLUCOSE 118* 11/16/2011   GLUCOSE 97 11/14/2011   BUN 13 11/16/2011   BUN 17 11/14/2011   CREATININE 0.62 11/16/2011   CREATININE 0.67 11/15/2011   CREATININE 0.66 11/14/2011   CALCIUM 8.6 11/16/2011   CALCIUM 9.1 11/14/2011   LFT  Basename 11/16/11 0606 11/14/11 1343  PROT 6.8 8.2  ALBUMIN 3.5 4.8  AST 118* 479*  ALT 304* 496*  ALKPHOS 292* 316*  BILITOT 0.7 0.8  BILIDIR -- --  IBILI -- --  Lipase        46                  40   PT/INR Lab Results  Component Value Date   INR 1.10 11/16/2011    RADIOLOGY STUDIES: Dg Abd 1 View  11/14/2011  *RADIOLOGY REPORT*  Clinical Data: Epigastric pain  ABDOMEN - 1 VIEW  Comparison: None.  Findings: No dilated loops of large or small bowel.  No pathologic calcifications. Rounded vascular calcification in left lower pelvis.  No acute abnormality.  IMPRESSION: Normal abdominal radiograph.  Clinically significant discrepancy from primary report, if provided: None  Original Report Authenticated By: Genevive Bi, M.D.   US Abdomen Complete  11/15/2011  *RADIOLOGY REPORT*  Clinical Data:  Epigastric pain, nausea, vomiting and bloating  COMPLETE ABDOMINAL ULTRASOUND  Comparison:  Abdomen film of 11/14/2011  Findings:  Gallbladder:  The  gallbladder is visualized and multiple gallstones and gallbladder sludge are present.  The largest gallstone measures 2.5 cm in maximum diameter.  There is no pain over the gallbladder with compression currently.  Common bile duct:  The common bile duct measures up to  7.7 cm in diameter, and a distal common bile duct calculus cannot be excluded.  Correlation with liver function tests is recommended.  Liver:  The liver has a normal echogenic pattern.  No ductal dilatation is seen.  IVC:  Appears normal.  Pancreas:  No focal abnormality seen.  Spleen:  The spleen is normal measuring 4.3 cm sagittally.  Right Kidney:  No hydronephrosis is seen.  The right kidney measures 10.5 cm sagittally.  Left Kidney:  No hydronephrosis is noted.  The left kidney measures 10.7 cm.  Abdominal aorta:  The abdominal aorta is normal in caliber.  IMPRESSION:  1.  Multiple gallstones,  the largest measuring 2.5 cm.  No present evidence of acute cholecystitis is noted by ultrasound. 2.  Slightly prominent common bile duct.  Cannot exclude distal common bile duct calculus.  Original Report Authenticated By: Juline Patch, M.D.    ENDOSCOPIC STUDIES: none  IMPRESSION: 1.  Cholelithiasis.   2.  ? Choledocholithiasis?  LFTs improving suggesting passed CBD stone. .  3.  Biliary colic.   4.  Hypokalemia.   PLAN: 1.  Needs surgical eval.   Given improving LFTs and clinical sxs, not convinced ERCP is necessary 2.  Will allow clear liquids.  3.  Might want to add potassium to IVF   LOS: 1 day   Jennye Moccasin  11/16/2011, 9:33 AM Pager: [phone]

## 2011-11-16 NOTE — Consult Note (Signed)
[name] 02/11/1976  [id number].   Primary Care MD: none Requesting MD: Dr. Lina Sar Chief Complaint/Reason for Consult: cholelithiasis HPI: This is a 36 yo hispanic female who has been having intermittent episodes of epigastric discomfort after eating for the past 2 years.  She would get intermittent episodes that were never that bad and then they would go away on their own.  They have started becoming more frequent over the last year.  On Saturday she developed epigastric abdominal pain.  It radiated to her back and her bilateral upper quadrants.  She had nausea and vomiting.  She went to the urgent care and was ultimately admitted to [facility] for further evaluation.  She was found to have elevated LFTs and an ultrasound that showed gallstones and some CBD dilatation.  GI evaluated the patient.  Her labs were trending down today so they felt that she did not need an ERCP at this time.  We have been asked to see the patient for lap chole.  Review of Systems: Please see HPI, otherwise all other systems have been reviewed and are negative.  Family History  Problem Relation Age of Onset  . Hypertension Mother   . Diabetes Maternal Grandfather   . Gallbladder disease Mother 40    gb removed.     Past Medical History  Diagnosis Date  . History of bronchitis     "multiple times"  . Reactive airway disease   . Asthma   . GERD (gastroesophageal reflux disease)   . Hepatitis A 1986  . Migraines     "not often"  . Kidney stones   . Chronic lower back pain     Past Surgical History  Procedure Date  . No past surgeries     Social History:  reports that she has never smoked. She has never used smokeless tobacco. She reports that she drinks about .6 ounces of alcohol per week. She reports that she does not use illicit drugs.  Allergies: No Known Allergies  Medications Prior to Admission  Medication Sig Dispense Refill  . fexofenadine (ALLEGRA) 180 MG tablet Take 180 mg by  mouth daily.        Blood pressure 99/67, pulse 56, temperature 97.7 F (36.5 C), temperature source Oral, resp. rate 16, height 5\' 2"  (1.575 m), weight 198 lb 10.2 oz (90.1 kg), last menstrual period 11/12/2011, SpO2 99.00%. Physical Exam: General: pleasant, WD, WN hispanic female who is laying in bed in NAD HEENT: head is normocephalic, atraumatic.  Sclera are noninjected.  PERRL.  Ears and nose without any masses or lesions.  Mouth is pink and moist Heart: regular, rate, and rhythm.  Normal s1,s2. No obvious murmurs, gallops, or rubs noted.  Palpable radial and pedal pulses bilaterally Lungs: CTAB, no wheezes, rhonchi, or rales noted.  Respiratory effort nonlabored Abd: soft, mild epigastric tenderness, no significant RUQ tenderness, ND, +BS, no masses, hernias, or organomegaly MS: all 4 extremities are symmetrical with no cyanosis, clubbing, or edema. Skin: warm and dry with no masses, lesions, or rashes Psych: A&Ox3 with an appropriate affect.    Results for orders placed during the hospital encounter of 11/15/11 (from the past 48 hour(s))  CBC     Status: Abnormal   Collection Time   11/15/11  9:47 PM      Component Value Range Comment   WBC 11.0 (*) 4.0 - 10.5 (K/uL)    RBC 4.49  3.87 - 5.11 (MIL/uL)    Hemoglobin 12.1  12.0 -  15.0 (g/dL)    HCT 16.1  09.6 - 04.5 (%)    MCV 81.5  78.0 - 100.0 (fL)    MCH 26.9  26.0 - 34.0 (pg)    MCHC 33.1  30.0 - 36.0 (g/dL)    RDW 40.9  81.1 - 91.4 (%)    Platelets 261  150 - 400 (K/uL)   CREATININE, SERUM     Status: Normal   Collection Time   11/15/11  9:47 PM      Component Value Range Comment   Creatinine, Ser 0.67  0.50 - 1.10 (mg/dL)    GFR calc non Af Amer >90  >90 (mL/min)    GFR calc Af Amer >90  >90 (mL/min)   COMPREHENSIVE METABOLIC PANEL     Status: Abnormal   Collection Time   11/16/11  6:06 AM      Component Value Range Comment   Sodium 137  135 - 145 (mEq/L)    Potassium 3.1 (*) 3.5 - 5.1 (mEq/L)    Chloride 102  96 - 112  (mEq/L)    CO2 25  19 - 32 (mEq/L)    Glucose, Bld 118 (*) 70 - 99 (mg/dL)    BUN 13  6 - 23 (mg/dL)    Creatinine, Ser 7.82  0.50 - 1.10 (mg/dL)    Calcium 8.6  8.4 - 10.5 (mg/dL)    Total Protein 6.8  6.0 - 8.3 (g/dL)    Albumin 3.5  3.5 - 5.2 (g/dL)    AST 956 (*) 0 - 37 (U/L)    ALT 304 (*) 0 - 35 (U/L)    Alkaline Phosphatase 292 (*) 39 - 117 (U/L)    Total Bilirubin 0.7  0.3 - 1.2 (mg/dL)    GFR calc non Af Amer >90  >90 (mL/min)    GFR calc Af Amer >90  >90 (mL/min)   CBC     Status: Abnormal   Collection Time   11/16/11  6:06 AM      Component Value Range Comment   WBC 6.1  4.0 - 10.5 (K/uL)    RBC 4.12  3.87 - 5.11 (MIL/uL)    Hemoglobin 11.3 (*) 12.0 - 15.0 (g/dL)    HCT 21.3 (*) 08.6 - 46.0 (%)    MCV 82.8  78.0 - 100.0 (fL)    MCH 27.4  26.0 - 34.0 (pg)    MCHC 33.1  30.0 - 36.0 (g/dL)    RDW 57.8  46.9 - 62.9 (%)    Platelets 227  150 - 400 (K/uL)   PROTIME-INR     Status: Normal   Collection Time   11/16/11  6:06 AM      Component Value Range Comment   Prothrombin Time 14.4  11.6 - 15.2 (seconds)    INR 1.10  0.00 - 1.49    AMYLASE     Status: Normal   Collection Time   11/16/11  6:06 AM      Component Value Range Comment   Amylase 56  0 - 105 (U/L)   LIPASE, BLOOD     Status: Normal   Collection Time   11/16/11  6:06 AM      Component Value Range Comment   Lipase 46  11 - 59 (U/L)    Dg Abd 1 View  11/14/2011  *RADIOLOGY REPORT*  Clinical Data: Epigastric pain  ABDOMEN - 1 VIEW  Comparison: None.  Findings: No dilated loops of large or small bowel.  No pathologic  calcifications. Rounded vascular calcification in left lower pelvis.  No acute abnormality.  IMPRESSION: Normal abdominal radiograph.  Clinically significant discrepancy from primary report, if provided: None  Original Report Authenticated By: Genevive Bi, M.D.   US Abdomen Complete  11/15/2011  *RADIOLOGY REPORT*  Clinical Data:  Epigastric pain, nausea, vomiting and bloating  COMPLETE ABDOMINAL  ULTRASOUND  Comparison:  Abdomen film of 11/14/2011  Findings:  Gallbladder:  The gallbladder is visualized and multiple gallstones and gallbladder sludge are present.  The largest gallstone measures 2.5 cm in maximum diameter.  There is no pain over the gallbladder with compression currently.  Common bile duct:  The common bile duct measures up to 7.7 cm in diameter, and a distal common bile duct calculus cannot be excluded.  Correlation with liver function tests is recommended.  Liver:  The liver has a normal echogenic pattern.  No ductal dilatation is seen.  IVC:  Appears normal.  Pancreas:  No focal abnormality seen.  Spleen:  The spleen is normal measuring 4.3 cm sagittally.  Right Kidney:  No hydronephrosis is seen.  The right kidney measures 10.5 cm sagittally.  Left Kidney:  No hydronephrosis is noted.  The left kidney measures 10.7 cm.  Abdominal aorta:  The abdominal aorta is normal in caliber.  IMPRESSION:  1.  Multiple gallstones,  the largest measuring 2.5 cm.  No present evidence of acute cholecystitis is noted by ultrasound. 2.  Slightly prominent common bile duct.  Cannot exclude distal common bile duct calculus.  Original Report Authenticated By: Juline Patch, M.D.       Assessment/Plan 1. Biliary colic 2. ? Choledocholithiasis  Plan: 1. It does appear that the patient's lab are trending down and that she may have passed a stone.  Unfortunately we can not proceed with OR today given our schedule.  I will make her NPO after MN and plan for hopefully cholecystectomy tomorrow or possibly Sunday.  I have d/w the patient this plan.  If for some reason her LFTs begin to escalate, then she may require an ERCP preoperatively, but agree that does not appear necessary currently.  We can do a cholangiogram in the OR to determine if she has a CBD stone still present.  Thank you for this consult.  We will follow.  OSBORNE,KELLY E 11/16/2011, 11:20 AM  It sounds and she has had intermittent symptoms  for some time.  We will try to do cholecystectomy asap. The risks of infection, bleeding, pain, persistent symptoms, scarring, injury to bowel or bile ducts, retained stone, diarrhea, need for additional procedures, and need for open surgery discussed with the patient.

## 2011-11-16 NOTE — H&P (Signed)
[facility] Attending Admission Note: Jaleah Lefevre MD [phone] pager office [phone] I  have seen and examined this patient, reviewed their chart. I have discussed this patient with the resident. I agree with the resident's findings, assessment and care plan. 

## 2011-11-16 NOTE — Consult Note (Signed)
I have reviewed the above note, examined the patient and agree with plan of treatment.She is currently pain free. She had an episode lasting about 3 hours which was consistent with biliary colic. Since her LFT's were trending down ,and clinically pain free, I agree that going ahead with lap chole would be preferable. She understands that she may need ERCP later if IOC show retained stones.  Surgery already saw the pt  And is planning to put pt on OR schedule

## 2011-11-17 ENCOUNTER — Inpatient Hospital Stay (HOSPITAL_COMMUNITY): Payer: BC Managed Care – PPO

## 2011-11-17 ENCOUNTER — Encounter (HOSPITAL_COMMUNITY): Payer: Self-pay | Admitting: Anesthesiology

## 2011-11-17 ENCOUNTER — Inpatient Hospital Stay (HOSPITAL_COMMUNITY): Payer: BC Managed Care – PPO | Admitting: Anesthesiology

## 2011-11-17 ENCOUNTER — Encounter (HOSPITAL_COMMUNITY)
Admission: AD | Disposition: A | Discharge status: Home / Self Care | Payer: Self-pay | Source: Ambulatory Visit | Attending: Family Medicine

## 2011-11-17 DIAGNOSIS — R1011 Right upper quadrant pain: Secondary | ICD-10-CM

## 2011-11-17 DIAGNOSIS — K8010 Calculus of gallbladder with chronic cholecystitis without obstruction: Secondary | ICD-10-CM

## 2011-11-17 HISTORY — PX: CHOLECYSTECTOMY: SHX55

## 2011-11-17 LAB — HEPATIC FUNCTION PANEL
AST: 52 U/L — ABNORMAL HIGH (ref 0–37)
Bilirubin, Direct: 0.1 mg/dL (ref 0.0–0.3)
Indirect Bilirubin: 0.6 mg/dL (ref 0.3–0.9)
Total Bilirubin: 0.7 mg/dL (ref 0.3–1.2)

## 2011-11-17 LAB — AMYLASE: Amylase: 59 U/L (ref 0–105)

## 2011-11-17 LAB — SURGICAL PCR SCREEN: MRSA, PCR: NEGATIVE

## 2011-11-17 SURGERY — LAPAROSCOPIC CHOLECYSTECTOMY WITH INTRAOPERATIVE CHOLANGIOGRAM
Anesthesia: General | Site: Abdomen | Wound class: Contaminated

## 2011-11-17 MED ORDER — VECURONIUM BROMIDE 10 MG IV SOLR
INTRAVENOUS | Status: DC | PRN
  Administered 2011-11-17: 6 mg via INTRAVENOUS

## 2011-11-17 MED ORDER — DEXTROSE 5 % IV SOLN
INTRAVENOUS | Status: DC | PRN
  Administered 2011-11-17: 17:00:00 via INTRAVENOUS

## 2011-11-17 MED ORDER — DEXAMETHASONE SODIUM PHOSPHATE 4 MG/ML IJ SOLN
INTRAMUSCULAR | Status: DC | PRN
  Administered 2011-11-17: 4 mg via INTRAVENOUS

## 2011-11-17 MED ORDER — HYDROMORPHONE HCL PF 1 MG/ML IJ SOLN
0.2500 mg | INTRAMUSCULAR | Status: DC | PRN
  Administered 2011-11-17: 0.5 mg via INTRAVENOUS
  Filled 2011-11-17: qty 1

## 2011-11-17 MED ORDER — FENTANYL CITRATE 0.05 MG/ML IJ SOLN
INTRAMUSCULAR | Status: DC | PRN
  Administered 2011-11-17 (×3): 50 ug via INTRAVENOUS
  Administered 2011-11-17: 150 ug via INTRAVENOUS

## 2011-11-17 MED ORDER — SODIUM CHLORIDE 0.9 % IV SOLN
INTRAVENOUS | Status: DC | PRN
  Administered 2011-11-17: 18:00:00

## 2011-11-17 MED ORDER — CEFAZOLIN SODIUM 1-5 GM-% IV SOLN
INTRAVENOUS | Status: DC | PRN
  Administered 2011-11-17: 1 g via INTRAVENOUS

## 2011-11-17 MED ORDER — GLYCOPYRROLATE 0.2 MG/ML IJ SOLN
INTRAMUSCULAR | Status: DC | PRN
  Administered 2011-11-17: .6 mg via INTRAVENOUS

## 2011-11-17 MED ORDER — ACETAMINOPHEN 10 MG/ML IV SOLN
INTRAVENOUS | Status: AC
  Filled 2011-11-17: qty 100

## 2011-11-17 MED ORDER — LIDOCAINE-EPINEPHRINE 1 %-1:100000 IJ SOLN
INTRAMUSCULAR | Status: DC | PRN
  Administered 2011-11-17: 10 mL

## 2011-11-17 MED ORDER — MIDAZOLAM HCL 5 MG/5ML IJ SOLN
INTRAMUSCULAR | Status: DC | PRN
  Administered 2011-11-17: 2 mg via INTRAVENOUS

## 2011-11-17 MED ORDER — PROPOFOL 10 MG/ML IV EMUL
INTRAVENOUS | Status: DC | PRN
  Administered 2011-11-17: 200 mg via INTRAVENOUS

## 2011-11-17 MED ORDER — ALBUTEROL SULFATE HFA 108 (90 BASE) MCG/ACT IN AERS: 2.0000 | INHALATION_SPRAY | RESPIRATORY_TRACT | Status: DC | PRN

## 2011-11-17 MED ORDER — ONDANSETRON HCL 4 MG PO TABS: 4.0000 mg | ORAL_TABLET | Freq: Four times a day (QID) | ORAL | Status: DC | PRN

## 2011-11-17 MED ORDER — ACETAMINOPHEN 10 MG/ML IV SOLN
INTRAVENOUS | Status: DC | PRN
  Administered 2011-11-17: 1000 mg via INTRAVENOUS

## 2011-11-17 MED ORDER — BECLOMETHASONE DIPROPIONATE 40 MCG/ACT IN AERS: 2.0000 | INHALATION_SPRAY | Freq: Two times a day (BID) | RESPIRATORY_TRACT | Status: DC

## 2011-11-17 MED ORDER — ONDANSETRON HCL 4 MG/2ML IJ SOLN: 4.0000 mg | Freq: Four times a day (QID) | INTRAMUSCULAR | Status: DC | PRN

## 2011-11-17 MED ORDER — DROPERIDOL 2.5 MG/ML IJ SOLN
INTRAMUSCULAR | Status: DC | PRN
  Administered 2011-11-17: 0.625 mg via INTRAVENOUS

## 2011-11-17 MED ORDER — SODIUM CHLORIDE 0.9 % IR SOLN
Status: DC | PRN
  Administered 2011-11-17: 1

## 2011-11-17 MED ORDER — LIDOCAINE HCL (CARDIAC) 20 MG/ML IV SOLN
INTRAVENOUS | Status: DC | PRN
  Administered 2011-11-17 (×2): 50 mg via INTRAVENOUS

## 2011-11-17 MED ORDER — HYDROCODONE-ACETAMINOPHEN 5-325 MG PO TABS
1.0000 | ORAL_TABLET | ORAL | Status: DC | PRN
  Administered 2011-11-18 – 2011-11-19 (×4): 1 via ORAL
  Filled 2011-11-17 (×3): qty 1
  Filled 2011-11-17: qty 2

## 2011-11-17 MED ORDER — NEOSTIGMINE METHYLSULFATE 1 MG/ML IJ SOLN
INTRAMUSCULAR | Status: DC | PRN
  Administered 2011-11-17: 5 mg via INTRAVENOUS

## 2011-11-17 MED ORDER — SUCCINYLCHOLINE CHLORIDE 20 MG/ML IJ SOLN
INTRAMUSCULAR | Status: DC | PRN
  Administered 2011-11-17: 100 mg via INTRAVENOUS

## 2011-11-17 MED ORDER — CEFAZOLIN SODIUM 1-5 GM-% IV SOLN
INTRAVENOUS | Status: AC
  Filled 2011-11-17: qty 50

## 2011-11-17 MED ORDER — ENOXAPARIN SODIUM 40 MG/0.4ML ~~LOC~~ SOLN
40.0000 mg | SUBCUTANEOUS | Status: DC
  Administered 2011-11-18 – 2011-11-19 (×2): 40 mg via SUBCUTANEOUS
  Filled 2011-11-17 (×3): qty 0.4

## 2011-11-17 MED ORDER — FLUTICASONE PROPIONATE HFA 44 MCG/ACT IN AERO
1.0000 | INHALATION_SPRAY | Freq: Two times a day (BID) | RESPIRATORY_TRACT | Status: DC
  Filled 2011-11-17: qty 10.6

## 2011-11-17 MED ORDER — MIDAZOLAM HCL 5 MG/5ML IJ SOLN
1.0000 mg | Freq: Once | INTRAMUSCULAR | Status: AC
  Administered 2011-11-17: 1 mg via INTRAVENOUS

## 2011-11-17 MED ORDER — LACTATED RINGERS IV SOLN
INTRAVENOUS | Status: DC | PRN
  Administered 2011-11-17 (×2): via INTRAVENOUS

## 2011-11-17 MED ORDER — BUPIVACAINE HCL (PF) 0.25 % IJ SOLN
INTRAMUSCULAR | Status: DC | PRN
  Administered 2011-11-17: 10 mL

## 2011-11-17 MED ORDER — MORPHINE SULFATE 2 MG/ML IJ SOLN
2.0000 mg | INTRAMUSCULAR | Status: DC | PRN
  Administered 2011-11-17 – 2011-11-18 (×2): 2 mg via INTRAVENOUS
  Filled 2011-11-17 (×2): qty 1

## 2011-11-17 MED ORDER — ONDANSETRON HCL 4 MG/2ML IJ SOLN
INTRAMUSCULAR | Status: DC | PRN
  Administered 2011-11-17: 4 mg via INTRAVENOUS

## 2011-11-17 MED ORDER — METOCLOPRAMIDE HCL 5 MG/ML IJ SOLN
INTRAMUSCULAR | Status: DC | PRN
  Administered 2011-11-17: 10 mg via INTRAVENOUS

## 2011-11-17 SURGICAL SUPPLY — 38 items
APPLIER CLIP ROT 10 11.4 M/L (STAPLE) ×2
BLADE SURG ROTATE 9660 (MISCELLANEOUS) IMPLANT
CANISTER SUCTION 2500CC (MISCELLANEOUS) IMPLANT
CATH REDDICK CHOLANGI 4FR 50CM (CATHETERS) ×2 IMPLANT
CHLORAPREP W/TINT 26ML (MISCELLANEOUS) ×2 IMPLANT
CLIP APPLIE ROT 10 11.4 M/L (STAPLE) ×1 IMPLANT
CLOTH BEACON ORANGE TIMEOUT ST (SAFETY) ×2 IMPLANT
COVER SURGICAL LIGHT HANDLE (MISCELLANEOUS) ×2 IMPLANT
DECANTER SPIKE VIAL GLASS SM (MISCELLANEOUS) IMPLANT
DERMABOND ADVANCED (GAUZE/BANDAGES/DRESSINGS) ×1
DERMABOND ADVANCED .7 DNX12 (GAUZE/BANDAGES/DRESSINGS) ×1 IMPLANT
DRAPE C-ARM 42X72 X-RAY (DRAPES) ×2 IMPLANT
ELECT CAUTERY BLADE 6.4 (BLADE) ×2 IMPLANT
ELECT REM PT RETURN 9FT ADLT (ELECTROSURGICAL) ×2
ELECTRODE REM PT RTRN 9FT ADLT (ELECTROSURGICAL) ×1 IMPLANT
GLOVE SURG SS PI 7.5 STRL IVOR (GLOVE) ×4 IMPLANT
GOWN PREVENTION PLUS XLARGE (GOWN DISPOSABLE) IMPLANT
GOWN STRL NON-REIN LRG LVL3 (GOWN DISPOSABLE) ×6 IMPLANT
IV CATH 14GX2 1/4 (CATHETERS) ×2 IMPLANT
KIT BASIN OR (CUSTOM PROCEDURE TRAY) ×2 IMPLANT
KIT ROOM TURNOVER OR (KITS) ×2 IMPLANT
NS IRRIG 1000ML POUR BTL (IV SOLUTION) ×2 IMPLANT
PAD ARMBOARD 7.5X6 YLW CONV (MISCELLANEOUS) ×2 IMPLANT
PENCIL BUTTON HOLSTER BLD 10FT (ELECTRODE) ×2 IMPLANT
POUCH SPECIMEN RETRIEVAL 10MM (ENDOMECHANICALS) ×2 IMPLANT
SCISSORS LAP 5X35 DISP (ENDOMECHANICALS) IMPLANT
SET IRRIG TUBING LAPAROSCOPIC (IRRIGATION / IRRIGATOR) ×2 IMPLANT
SLEEVE ENDOPATH XCEL 5M (ENDOMECHANICALS) ×2 IMPLANT
SPECIMEN JAR SMALL (MISCELLANEOUS) ×2 IMPLANT
SUT MNCRL AB 4-0 PS2 18 (SUTURE) ×4 IMPLANT
SUT VICRYL 0 UR6 27IN ABS (SUTURE) ×2 IMPLANT
TOWEL OR 17X24 6PK STRL BLUE (TOWEL DISPOSABLE) ×2 IMPLANT
TOWEL OR 17X26 10 PK STRL BLUE (TOWEL DISPOSABLE) ×2 IMPLANT
TRAY FOLEY CATH 14FR (SET/KITS/TRAYS/PACK) ×2 IMPLANT
TRAY LAPAROSCOPIC (CUSTOM PROCEDURE TRAY) ×2 IMPLANT
TROCAR BALLN 12MMX100 BLUNT (TROCAR) ×2 IMPLANT
TROCAR XCEL NON-BLD 11X100MML (ENDOMECHANICALS) ×2 IMPLANT
TROCAR XCEL NON-BLD 5MMX100MML (ENDOMECHANICALS) ×2 IMPLANT

## 2011-11-17 NOTE — Transfer of Care (Signed)
Immediate Anesthesia Transfer of Care Note  Patient: [name]  Procedure(s) Performed: Procedure(s) (LRB): LAPAROSCOPIC CHOLECYSTECTOMY WITH INTRAOPERATIVE CHOLANGIOGRAM (N/A)  Patient Location: PACU  Anesthesia Type: General  Level of Consciousness: oriented, sedated, patient cooperative and responds to stimulation  Airway & Oxygen Therapy: Patient Spontanous Breathing and Patient connected to nasal cannula oxygen  Post-op Assessment: Report given to PACU RN, Post -op Vital signs reviewed and stable, Patient moving all extremities and Patient moving all extremities X 4  Post vital signs: Reviewed and stable  Complications: No apparent anesthesia complications

## 2011-11-17 NOTE — Anesthesia Preprocedure Evaluation (Addendum)
Anesthesia Evaluation  Patient identified by MRN, date of birth, ID band Patient awake    Reviewed: Allergy & Precautions, H&P , NPO status , Patient's Chart, lab work & pertinent test results, reviewed documented beta blocker date and time   History of Anesthesia Complications Negative for: history of anesthetic complications  Airway Mallampati: I TM Distance: >3 FB Neck ROM: Full    Dental No notable dental hx. (+) Teeth Intact and Dental Advisory Given   Pulmonary asthma (controlled with Qvar) ,  breath sounds clear to auscultation  Pulmonary exam normal       Cardiovascular negative cardio ROS  Rhythm:Regular Rate:Normal     Neuro/Psych negative neurological ROS     GI/Hepatic GERD- (n/v with gallbladder)  ,(+) Hepatitis -, A  Endo/Other  negative endocrine ROS  Renal/GU negative Renal ROS     Musculoskeletal   Abdominal (+) + obese,   Peds  Hematology   Anesthesia Other Findings   Reproductive/Obstetrics Denies any chance of pregnancy, 11/14/11 urine preg neg                          Anesthesia Physical Anesthesia Plan  ASA: II  Anesthesia Plan: General   Post-op Pain Management:    Induction: Intravenous  Airway Management Planned: Oral ETT  Additional Equipment:   Intra-op Plan:   Post-operative Plan: Extubation in OR  Informed Consent: I have reviewed the patients History and Physical, chart, labs and discussed the procedure including the risks, benefits and alternatives for the proposed anesthesia with the patient or authorized representative who has indicated his/her understanding and acceptance.   Dental advisory given  Plan Discussed with: Surgeon and CRNA  Anesthesia Plan Comments: (Plan routine monitors, GETA)       Anesthesia Quick Evaluation

## 2011-11-17 NOTE — Anesthesia Postprocedure Evaluation (Signed)
  Anesthesia Post-op Note  Patient: [name]  Procedure(s) Performed: Procedure(s) (LRB): LAPAROSCOPIC CHOLECYSTECTOMY WITH INTRAOPERATIVE CHOLANGIOGRAM (N/A)  Patient Location: PACU  Anesthesia Type: General  Level of Consciousness: awake, alert  and oriented  Airway and Oxygen Therapy: Patient Spontanous Breathing  Post-op Pain: none  Post-op Assessment: Post-op Vital signs reviewed, Patient's Cardiovascular Status Stable, Respiratory Function Stable, Patent Airway, No signs of Nausea or vomiting and Pain level controlled  Post-op Vital Signs: Reviewed and stable  Complications: No apparent anesthesia complications

## 2011-11-17 NOTE — Progress Notes (Signed)
  Subjective: Feels better, no pain  Objective: Vital signs in last 24 hours: Temp:  [97.4 F (36.3 C)-98.6 F (37 C)] 97.4 F (36.3 C) (05/04 0500) Pulse Rate:  [48-68] 52  (05/04 0500) Resp:  [20] 20  (05/04 0500) BP: (92-106)/(59-66) 92/66 mmHg (05/04 0500) SpO2:  [98 %-99 %] 99 % (05/04 0500) Last BM Date: 11/16/11  Intake/Output from previous day: 05/03 0701 - 05/04 0700 In: 480 [P.O.:480] Out: 1 [Urine:1] Intake/Output this shift:    General appearance: alert, cooperative and no distress Resp: clear to auscultation bilaterally Cardio: regular rate and rhythm, S1, S2 normal, no murmur, click, rub or gallop GI: soft, non-tender; bowel sounds normal; no masses,  no organomegaly  Lab Results:  @LABLAST2 (wbc:2,hgb:2,hct:2,plt:2) BMET  Basename 11/16/11 0606 11/15/11 2147 11/14/11 1343  NA 137 -- 142  K 3.1* -- 4.4  CL 102 -- 106  CO2 25 -- 26  GLUCOSE 118* -- 97  BUN 13 -- 17  CREATININE 0.62 0.67 --  CALCIUM 8.6 -- 9.1   PT/INR  Basename 11/16/11 0606  LABPROT 14.4  INR 1.10   ABG No results found for this basename: PHART:2,PCO2:2,PO2:2,HCO3:2 in the last 72 hours  Studies/Results: US Abdomen Complete  11/15/2011  *RADIOLOGY REPORT*  Clinical Data:  Epigastric pain, nausea, vomiting and bloating  COMPLETE ABDOMINAL ULTRASOUND  Comparison:  Abdomen film of 11/14/2011  Findings:  Gallbladder:  The gallbladder is visualized and multiple gallstones and gallbladder sludge are present.  The largest gallstone measures 2.5 cm in maximum diameter.  There is no pain over the gallbladder with compression currently.  Common bile duct:  The common bile duct measures up to 7.7 cm in diameter, and a distal common bile duct calculus cannot be excluded.  Correlation with liver function tests is recommended.  Liver:  The liver has a normal echogenic pattern.  No ductal dilatation is seen.  IVC:  Appears normal.  Pancreas:  No focal abnormality seen.  Spleen:  The spleen is normal  measuring 4.3 cm sagittally.  Right Kidney:  No hydronephrosis is seen.  The right kidney measures 10.5 cm sagittally.  Left Kidney:  No hydronephrosis is noted.  The left kidney measures 10.7 cm.  Abdominal aorta:  The abdominal aorta is normal in caliber.  IMPRESSION:  1.  Multiple gallstones,  the largest measuring 2.5 cm.  No present evidence of acute cholecystitis is noted by ultrasound. 2.  Slightly prominent common bile duct.  Cannot exclude distal common bile duct calculus.  Original Report Authenticated By: Juline Patch, M.D.    Anti-infectives: Anti-infectives    None      Assessment/Plan: s/p Procedure(s): LAPAROSCOPIC CHOLECYSTECTOMY WITH INTRAOPERATIVE CHOLANGIOGRAM keep NPO and plan for OR today  LOS: 2 days    [name] DAVID 11/17/2011

## 2011-11-17 NOTE — Preoperative (Signed)
Beta Blockers   Reason not to administer Beta Blockers:Not Applicable 

## 2011-11-17 NOTE — Plan of Care (Signed)
Problem: Phase I Progression Outcomes Goal: Initial discharge plan identified Outcome: Completed/Met Date Met:  11/17/11 Return home

## 2011-11-17 NOTE — Anesthesia Procedure Notes (Signed)
Procedure Name: Intubation Date/Time: 11/17/2011 5:10 PM Performed by: Wray Kearns A Pre-anesthesia Checklist: Patient identified, Timeout performed, Emergency Drugs available, Suction available and Patient being monitored Patient Re-evaluated:Patient Re-evaluated prior to inductionOxygen Delivery Method: Circle system utilized Preoxygenation: Pre-oxygenation with 100% oxygen Intubation Type: IV induction, Rapid sequence and Cricoid Pressure applied Ventilation: Mask ventilation without difficulty Laryngoscope Size: Mac and 4 Grade View: Grade I Tube type: Oral Tube size: 7.0 mm Number of attempts: 1 Airway Equipment and Method: Stylet Placement Confirmation: ETT inserted through vocal cords under direct vision,  breath sounds checked- equal and bilateral,  positive ETCO2 and CO2 detector Secured at: 22 cm Tube secured with: Tape Dental Injury: Teeth and Oropharynx as per pre-operative assessment

## 2011-11-17 NOTE — Progress Notes (Signed)
Subjective No complaint this am, slept well, NPO  Objective: Vital signs in last 24 hours: Temp:  [97.4 F (36.3 C)-98.6 F (37 C)] 97.4 F (36.3 C) (05/04 0500) Pulse Rate:  [48-68] 52  (05/04 0500) Resp:  [20] 20  (05/04 0500) BP: (92-106)/(59-66) 92/66 mmHg (05/04 0500) SpO2:  [98 %-99 %] 99 % (05/04 0500) Last BM Date: 11/16/11 General:   Alert,  pleasant, cooperative in NAD Head:  Normocephalic and atraumatic. Eyes:  Sclera clear, no icterus.   Conjunctiva pink. Neck:  Supple; no masses or thyromegaly. Heart:  Regular rate and rhythm; no murmurs, clicks, rubs,  or gallops. Lungs:  No wheezes or rales Abdomen:  Soft, not distended, minimal tenderness RUQ, active bowl sounds  Extremities:  Without clubbing or edema. Neurologic:  Alert and  oriented x4;  grossly normal neurologically. Skin:  Intact without significant lesions or rashes.  Intake/Output from previous day: 05/03 0701 - 05/04 0700 In: 480 [P.O.:480] Out: 1 [Urine:1] Intake/Output this shift:    Lab Results:  Basename 11/16/11 0606 11/15/11 2147 11/14/11 1351  WBC 6.1 11.0* 7.5  HGB 11.3* 12.1 12.6  HCT 34.1* 36.6 39.4  PLT 227 261 --   BMET  Basename 11/16/11 0606 11/15/11 2147 11/14/11 1343  NA 137 -- 142  K 3.1* -- 4.4  CL 102 -- 106  CO2 25 -- 26  GLUCOSE 118* -- 97  BUN 13 -- 17  CREATININE 0.62 0.67 0.66  CALCIUM 8.6 -- 9.1   LFT  Basename 11/16/11 0606  PROT 6.8  ALBUMIN 3.5  AST 118*  ALT 304*  ALKPHOS 292*  BILITOT 0.7  BILIDIR --  IBILI --   PT/INR  Basename 11/16/11 0606  LABPROT 14.4  INR 1.10   Hepatitis Panel No results found for this basename: HEPBSAG,HCVAB,HEPAIGM,HEPBIGM in the last 72 hours  Studies/Results: US Abdomen Complete  11/15/2011  *RADIOLOGY REPORT*  Clinical Data:  Epigastric pain, nausea, vomiting and bloating  COMPLETE ABDOMINAL ULTRASOUND  Comparison:  Abdomen film of 11/14/2011  Findings:  Gallbladder:  The gallbladder is visualized and multiple  gallstones and gallbladder sludge are present.  The largest gallstone measures 2.5 cm in maximum diameter.  There is no pain over the gallbladder with compression currently.  Common bile duct:  The common bile duct measures up to 7.7 cm in diameter, and a distal common bile duct calculus cannot be excluded.  Correlation with liver function tests is recommended.  Liver:  The liver has a normal echogenic pattern.  No ductal dilatation is seen.  IVC:  Appears normal.  Pancreas:  No focal abnormality seen.  Spleen:  The spleen is normal measuring 4.3 cm sagittally.  Right Kidney:  No hydronephrosis is seen.  The right kidney measures 10.5 cm sagittally.  Left Kidney:  No hydronephrosis is noted.  The left kidney measures 10.7 cm.  Abdominal aorta:  The abdominal aorta is normal in caliber.  IMPRESSION:  1.  Multiple gallstones,  the largest measuring 2.5 cm.  No present evidence of acute cholecystitis is noted by ultrasound. 2.  Slightly prominent common bile duct.  Cannot exclude distal common bile duct calculus.  Original Report Authenticated By: Juline Patch, M.D.     ASSESSMENT:   Active Problems:  Cholelithiasis with choledocholithiasis  Nonspecific elevation of levels of transaminase or lactic acid dehydrogenase (LDH)  Abdominal pain, right upper quadrant     PLAN:   S/p biliary colic, clinically resolved, LFT's from this morning  were not ordered, but  are pending now, I still feel lap chole without ERCP is the preferred disposition. Will be available after lap chole  If IOC shows retained stone     LOS: 2 days   [name]  11/17/2011, 7:44 AM

## 2011-11-17 NOTE — Progress Notes (Signed)
FMTS Attending Daily Note: Kaleeya Hancock MD [phone] pager office [phone] I have discussed this patient with the resident and reviewed the assessment and plan as documented above. I agree wit the resident's findings and plan.  

## 2011-11-17 NOTE — Op Note (Signed)
[name], [name]              ACCOUNT NO.:  [id number]  MEDICAL RECORD NO.:  [id number]  LOCATION:                                 FACILITY:  PHYSICIAN:  Lodema Pilot, MD            DATE OF BIRTH:  DATE OF PROCEDURE:  11/17/2011 DATE OF DISCHARGE:                              OPERATIVE REPORT   PROCEDURE:  Laparoscopic cholecystectomy with intraoperative cholangiogram.  PREOPERATIVE DIAGNOSIS:  Choledocholithiasis.  POSTOPERATIVE DIAGNOSIS:  Choledocholithiasis.  SURGEON:  Lodema Pilot, MD  ASSISTANT:  None.  ANESTHESIA:  General endotracheal tube anesthesia with 30 mL of 1% lidocaine with epinephrine and 0.25% Marcaine in a 50:50 mixture.  FLUIDS:  1400 mL of crystalloid.  ESTIMATED BLOOD LOSS:  Minimal.  DRAINS:  None.  SPECIMENS:  Gallbladder and contents sent to pathology for permanent sectioning.  FINDINGS:  Multiple gallstones in the gallbladder.  Otherwise, normal anatomy and no evidence of choledocholithiasis with a normal cholangiogram.  INDICATION FOR PROCEDURE:  Ms. [name] presented with intermittent bilateral upper abdominal pain and nausea and vomiting with elevated LFTs.  She had an ultrasound with gallstones and a dilated common bile duct.  OPERATIVE DETAILS:  Ms. [name] was seen and evaluated in the preoperative area, and risks and benefits of procedure were again discussed in lay terms.  Informed consent was obtained and she was taken to the operating room, placed on the table in supine position, prophylactic antibiotics were given, and general endotracheal tube anesthesia was obtained.  Foley catheter was placed, and her abdomen was prepped and draped in a standard surgical fashion.  Procedure time-out was performed with all operative team members to confirm proper patient and procedure.  A supraumbilical midline incision was made in the skin and dissection carried down through the subcutaneous tissue to the abdominal wall fascia  using blunt dissection.  The abdominal wall fascia was elevated and sharply incised.  Peritoneum was entered under direct vision, and a 12-mm balloon port was placed at the umbilicus and pneumoperitoneum was obtained.  Laparoscope was introduced, and there was no evidence of bowel injury upon entry.  Three 5-mm ports were placed under direct visualization, 2 in the right upper quadrant and 1 in the epigastrium and the gallbladder was retracted cephalad.  The adhesions were taken down using blunt dissection, and she had no inflammatory changes of the gallbladder and normal-appearing anatomy. Peritoneum was taken down using blunt dissection, and the cystic artery was easily visualized and skeletonized and clipped between hemoclips, but not divided at this time.  Cystic duct was visualized and triangle of CLO was dissected out and cystic duct was skeletonized.  A critical view of safety was obtained visualizing a single cystic duct and single cystic artery and the duct was clipped on the gallbladder side and cholangiogram was performed, which demonstrated no filling defects in the common bile duct and normal-appearing right and left hepatic ducts and free flow of bile into the duodenum.  I had a long cystic duct.  The cholangiogram catheter was removed and 2 clips were placed on the cystic duct stump and the duct was transected.  The previously clipped artery was transected,  and the gallbladder was removed from the gallbladder fossa using Bovie electrocautery.  The gallbladder was not entered during the dissection, and the gallbladder was placed in an EndoCatch bag and removed from the umbilical trocar site.  She had palpable gallstones and this was sent to pathology for permanent section.  The port was replaced at the umbilicus and the gallbladder fossa was inspected for hemostasis and the clips appeared to be in good position. There was no evidence of bile, bleeding, or bowel injury.  The  right upper quadrant trocars were removed under direct visualization. Abdominal wall was noted to be hemostatic, and the balloon port was removed and the umbilical fascia was approximated with interrupted 0 Vicryl sutures in open fashion.  The abdomen was re-insufflated and the abdominal wall closure was noted to be adequate without any evidence of bowel injury, and the final trocars were removed.  Skin incisions were injected with 30 mL of 1% lidocaine with epinephrine and 0.25% Marcaine in a 50:50 mixture, skin edges were approximated with 4-0 Monocryl subcuticular suture and the skin was washed and dried and Dermabond was applied.  All sponge, needle, and instrument counts were correct at the end of the case.  The patient tolerated the procedure well without apparent complication.          ______________________________ Lodema Pilot, MD     BL/MEDQ  D:  11/17/2011  T:  11/17/2011  Job:  [id number]

## 2011-11-17 NOTE — Progress Notes (Signed)
Subjective: No complaints. Just feels RUQ pressure. No chest pain or SOB. She has been NPO since midnight.   Objective: Vital signs in last 24 hours: Temp:  [97.4 F (36.3 C)-98.6 F (37 C)] 97.4 F (36.3 C) (05/04 0500) Pulse Rate:  [48-68] 52  (05/04 0500) Resp:  [20] 20  (05/04 0500) BP: (92-106)/(59-66) 92/66 mmHg (05/04 0500) SpO2:  [98 %-99 %] 99 % (05/04 0500) Weight change:  Last BM Date: 11/16/11  Intake/Output from previous day: 05/03 0701 - 05/04 0700 In: 480 [P.O.:480] Out: 1 [Urine:1] Intake/Output this shift:   PE: General: Hispanic female, nad, comfortable Lungs:  Normal respiratory effort, chest expands symmetrically. Lungs are clear to auscultation, no crackles or wheezes. Heart - Regular rate and rhythm.  No murmurs, gallops or rubs.    Extremities:   Non-tender, No cyanosis, edema, or deformity noted. Abdomen: soft and non-tender without masses, organomegaly or hernias noted.  No guarding or rebound  Lab Results:  Basename 11/16/11 0606 11/15/11 2147  WBC 6.1 11.0*  HGB 11.3* 12.1  HCT 34.1* 36.6  PLT 227 261   BMET  Basename 11/16/11 0606 11/15/11 2147 11/14/11 1343  NA 137 -- 142  K 3.1* -- 4.4  CL 102 -- 106  CO2 25 -- 26  GLUCOSE 118* -- 97  BUN 13 -- 17  CREATININE 0.62 0.67 --  CALCIUM 8.6 -- 9.1    Studies/Results: US Abdomen Complete  11/15/2011  *RADIOLOGY REPORT*  Clinical Data:  Epigastric pain, nausea, vomiting and bloating  COMPLETE ABDOMINAL ULTRASOUND  Comparison:  Abdomen film of 11/14/2011  Findings:  Gallbladder:  The gallbladder is visualized and multiple gallstones and gallbladder sludge are present.  The largest gallstone measures 2.5 cm in maximum diameter.  There is no pain over the gallbladder with compression currently.  Common bile duct:  The common bile duct measures up to 7.7 cm in diameter, and a distal common bile duct calculus cannot be excluded.  Correlation with liver function tests is recommended.  Liver:  The  liver has a normal echogenic pattern.  No ductal dilatation is seen.  IVC:  Appears normal.  Pancreas:  No focal abnormality seen.  Spleen:  The spleen is normal measuring 4.3 cm sagittally.  Right Kidney:  No hydronephrosis is seen.  The right kidney measures 10.5 cm sagittally.  Left Kidney:  No hydronephrosis is noted.  The left kidney measures 10.7 cm.  Abdominal aorta:  The abdominal aorta is normal in caliber.  IMPRESSION:  1.  Multiple gallstones,  the largest measuring 2.5 cm.  No present evidence of acute cholecystitis is noted by ultrasound. 2.  Slightly prominent common bile duct.  Cannot exclude distal common bile duct calculus.  Original Report Authenticated By: Juline Patch, M.D.    Medications: I have reviewed the patient's current medications.  Assessment/Plan: 1. Recurrent biliary Colic, likely passed stone/choledocholithiasis: Lap Chole. Today. TBili 0.7   2. Diet NPO after midnight for surgery  3. Dispo: Pending surgery, likely tom. am.    LOS: 2 days   Eion Timbrook MD 11/17/2011, 7:28 AM

## 2011-11-18 NOTE — Progress Notes (Signed)
1 Day Post-Op  Subjective: Feels well. Tolerating liquids. Pain controlled  Objective: Vital signs in last 24 hours: Temp:  [97.9 F (36.6 C)-98.9 F (37.2 C)] 98.5 F (36.9 C) (05/05 0525) Pulse Rate:  [49-79] 59  (05/05 0525) Resp:  [11-20] 15  (05/05 0525) BP: (109-129)/(66-89) 119/73 mmHg (05/05 0525) SpO2:  [97 %-100 %] 97 % (05/05 0525) Last BM Date: 11/16/11  Intake/Output from previous day: 05/04 0701 - 05/05 0700 In: 2722.5 [I.V.:2722.5] Out: 725 [Urine:725] Intake/Output this shift:    General appearance: alert, cooperative and no distress Resp: clear to auscultation bilaterally Cardio: regular rate and rhythm, S1, S2 normal, no murmur, click, rub or gallop GI: soft, minimal incisional tenderness, ND, wounds without infection  Lab Results:   [facility] 11/16/11 0606 11/15/11 2147  WBC 6.1 11.0*  HGB 11.3* 12.1  HCT 34.1* 36.6  PLT 227 261   BMET  Basename 11/16/11 0606 11/15/11 2147  NA 137 --  K 3.1* --  CL 102 --  CO2 25 --  GLUCOSE 118* --  BUN 13 --  CREATININE 0.62 0.67  CALCIUM 8.6 --   PT/INR  Basename 11/16/11 0606  LABPROT 14.4  INR 1.10   ABG No results found for this basename: PHART:2,PCO2:2,PO2:2,HCO3:2 in the last 72 hours  Studies/Results: Dg Cholangiogram Operative  11/17/2011  *RADIOLOGY REPORT*  Clinical Data:   .  Cholecystectomy  INTRAOPERATIVE CHOLANGIOGRAM  Technique:  Cholangiographic images from the C-arm fluoroscopic device were submitted for interpretation post-operatively.  Please see the procedural report for the amount of contrast and the fluoroscopy time utilized.  Comparison:  None.  Findings:  There are no filling defects within the cystic duct remnant, common duct, or visualized intrahepatic ducts.  Contrast flows into the duodenum.  There is no extravasation of contrast. No strictures are identified.  11 seconds of fluoroscopy time were utilized.  IMPRESSION: There are no fixed filling defects, strictures, or  extravasation.  Original Report Authenticated By: Brandon Melnick, M.D.    Anti-infectives: Anti-infectives    None      Assessment/Plan: s/p Procedure(s) (LRB): LAPAROSCOPIC CHOLECYSTECTOMY WITH INTRAOPERATIVE CHOLANGIOGRAM (N/A) POD 1 lap chole, cholangiogram normal.  Looks good this am.  no evidence of postop complications.  diet as tolerated and can go home if tolerating diet.  F/u [facility] in 2-3 weeks or sooner prn, [phone]. May shower tomorrow and no other restrictions.    LOS: 3 days    Lodema Pilot DAVID 11/18/2011

## 2011-11-18 NOTE — Progress Notes (Signed)
Patient arrived from pacu, incisions to abdomen x4 with dermabond.  No redness or drainage present, pain rated 7/10 by patient.  Made comfortable in bed, call bell within reach.  Will continue to monitor.  Macarthur Critchley, RN

## 2011-11-18 NOTE — Progress Notes (Signed)
Family Medicine Teaching Service Daily Progress Note: [phone] Subjective: S/p cholecystectomy post op day 1 Pain 4/10 along epigastric area. Controled with IV medication. Tolerating liquid diet. No n/v. No bowel movement. No flatus.  Objective: Vital signs in last 24 hours: Temp:  [97.9 F (36.6 C)-98.9 F (37.2 C)] 98.5 F (36.9 C) (05/05 0525) Pulse Rate:  [49-79] 59  (05/05 0525) Resp:  [11-20] 15  (05/05 0525) BP: (109-129)/(66-89) 119/73 mmHg (05/05 0525) SpO2:  [97 %-100 %] 97 % (05/05 0525) Weight change:  Last BM Date: 11/16/11  Intake/Output from previous day: 05/04 0701 - 05/05 0700 In: 2722.5 [I.V.:2722.5] Out: 725 [Urine:725] Intake/Output this shift:   PE: General: Hispanic female, no acute distress Lungs:  Normal respiratory effort, chest expands symmetrically. Lungs are clear to auscultation, no crackles or wheezes. Heart - Regular rate and rhythm.  No murmurs, gallops or rubs.    Extremities:   Non-tender, No cyanosis, edema, or deformity noted. Abdomen: incision sites clean and dry, no erythema, tenderness to palpation along epigastrium, no rebound, no guarding, soft, present bowel sounds.   Lab Results:  Basename 11/16/11 0606 11/15/11 2147  WBC 6.1 11.0*  HGB 11.3* 12.1  HCT 34.1* 36.6  PLT 227 261   BMET  Basename 11/16/11 0606 11/15/11 2147  NA 137 --  K 3.1* --  CL 102 --  CO2 25 --  GLUCOSE 118* --  BUN 13 --  CREATININE 0.62 0.67  CALCIUM 8.6 --    Studies/Results: Dg Cholangiogram Operative  11/17/2011  *RADIOLOGY REPORT*  Clinical Data:   .  Cholecystectomy  INTRAOPERATIVE CHOLANGIOGRAM  Technique:  Cholangiographic images from the C-arm fluoroscopic device were submitted for interpretation post-operatively.  Please see the procedural report for the amount of contrast and the fluoroscopy time utilized.  Comparison:  None.  Findings:  There are no filling defects within the cystic duct remnant, common duct, or visualized intrahepatic  ducts.  Contrast flows into the duodenum.  There is no extravasation of contrast. No strictures are identified.  11 seconds of fluoroscopy time were utilized.  IMPRESSION: There are no fixed filling defects, strictures, or extravasation.  Original Report Authenticated By: Brandon Melnick, M.D.    Medications:    . enoxaparin  40 mg Subcutaneous Q24H  . midazolam  1 mg Intravenous Once   PRN: dilaudid 0.5mg  x1, morphine 2mg  x2 Assessment/Plan: 1. Choledocholithiasis: s/p cholecystectomy post op day1 - tolerating clears: advance diet to dull diet - d/c IV pain medication and start oral  2. Diet: advance to full diet. Saline locked. 3. Dispo: pending tolerating diet, passing gas and pain controled on oral medication. Likely tomorrow.    LOS: 3 days   Marena Chancy MD 11/18/2011, 11:08 AM

## 2011-11-18 NOTE — Progress Notes (Signed)
FMTS Attending Daily Note: Epifanio Labrador MD [phone] pager office [phone] I have discussed this patient with the resident and reviewed the assessment and plan as documented above. I agree wit the resident's findings and plan.  

## 2011-11-19 ENCOUNTER — Encounter (HOSPITAL_COMMUNITY): Payer: Self-pay | Admitting: Family Medicine

## 2011-11-19 LAB — BASIC METABOLIC PANEL
CO2: 27 mEq/L (ref 19–32)
Chloride: 101 mEq/L (ref 96–112)
GFR calc Af Amer: >90 mL/min (ref >90)
Potassium: 3.7 mEq/L (ref 3.5–5.1)
Sodium: 138 mEq/L (ref 135–145)

## 2011-11-19 LAB — CBC
HCT: 37.2 % (ref 36.0–46.0)
Hemoglobin: 12.2 g/dL (ref 12.0–15.0)
MCV: 82.7 fL (ref 78.0–100.0)
RBC: 4.5 MIL/uL (ref 3.87–5.11)
RDW: 13.4 % (ref 11.5–15.5)
WBC: 8.4 10*3/uL (ref 4.0–10.5)

## 2011-11-19 MED ORDER — HYDROCODONE-ACETAMINOPHEN 5-325 MG PO TABS: 1.0000 | ORAL_TABLET | ORAL | Status: AC | PRN

## 2011-11-19 NOTE — Progress Notes (Signed)
Looks good. abd soft, nd. Expected mild TTP. Incision c/d/i.  Ok to d/c from our pov Discussed with pt d/c instructions  Mary Sella. Andrey Campanile, MD, FACS General, Bariatric, & Minimally Invasive Surgery [facility], [place]

## 2011-11-19 NOTE — Progress Notes (Signed)
Discharge instructions reviewed with patient  Also prescriptions given to patient. Verbalize and understand . Skin WNL , dermabond intact over abdomen.

## 2011-11-19 NOTE — Progress Notes (Signed)
Patient ID: [name], female   DOB: [birth year], 36 y.o.   MRN: [id number] 2 Days Post-Op  Subjective: Pt without c/o except mild gas pains.  Tolerating a regular diet  Objective: Vital signs in last 24 hours: Temp:  [97.6 F (36.4 C)-98.3 F (36.8 C)] 97.6 F (36.4 C) (05/06 0528) Pulse Rate:  [57-60] 58  (05/06 0528) Resp:  [15-16] 15  (05/06 0528) BP: (99-120)/(65-76) 99/65 mmHg (05/06 0528) SpO2:  [97 %-98 %] 98 % (05/06 0528) Last BM Date: 11/16/11  Intake/Output from previous day:   Intake/Output this shift:    PE: Abd: soft, minimally tender, +BS, incisions c/d/i  Lab Results:   Basename 11/19/11 0546  WBC 8.4  HGB 12.2  HCT 37.2  PLT 268   BMET  Basename 11/19/11 0546  NA 138  K 3.7  CL 101  CO2 27  GLUCOSE 89  BUN 10  CREATININE 0.61  CALCIUM 9.2   PT/INR No results found for this basename: LABPROT:2,INR:2 in the last 72 hours CMP     Component Value Date/Time   NA 138 11/19/2011 0546   K 3.7 11/19/2011 0546   CL 101 11/19/2011 0546   CO2 27 11/19/2011 0546   GLUCOSE 89 11/19/2011 0546   BUN 10 11/19/2011 0546   CREATININE 0.61 11/19/2011 0546   CREATININE 0.66 11/14/2011 1343   CALCIUM 9.2 11/19/2011 0546   PROT 7.5 11/17/2011 0800   ALBUMIN 3.6 11/17/2011 0800   AST 52* 11/17/2011 0800   ALT 223* 11/17/2011 0800   ALKPHOS 254* 11/17/2011 0800   BILITOT 0.7 11/17/2011 0800   GFRNONAA >90 11/19/2011 0546   GFRAA >90 11/19/2011 0546   Lipase     Component Value Date/Time   LIPASE 52 11/17/2011 0800       Studies/Results: Dg Cholangiogram Operative  11/17/2011  *RADIOLOGY REPORT*  Clinical Data:   .  Cholecystectomy  INTRAOPERATIVE CHOLANGIOGRAM  Technique:  Cholangiographic images from the C-arm fluoroscopic device were submitted for interpretation post-operatively.  Please see the procedural report for the amount of contrast and the fluoroscopy time utilized.  Comparison:  None.  Findings:  There are no filling defects within the cystic duct remnant,  common duct, or visualized intrahepatic ducts.  Contrast flows into the duodenum.  There is no extravasation of contrast. No strictures are identified.  11 seconds of fluoroscopy time were utilized.  IMPRESSION: There are no fixed filling defects, strictures, or extravasation.  Original Report Authenticated By: Brandon Melnick, M.D.    Anti-infectives: Anti-infectives    None       Assessment/Plan  1. S/p lap chole  Plan: 1. Ok for [employer] home from our standpoint.  Dc instructions placed in dc instruction place in computer.   LOS: 4 days    Xzaviar Maloof E 11/19/2011

## 2011-11-19 NOTE — Discharge Summary (Signed)
Physician Discharge Summary   Patient ID: [name] [id number] y.o. 12-15-1975  Admit date: 11/15/2011  Discharge date and time: 11/19/2011  4:00 PM   Admitting Physician: Nestor Ramp, MD   Discharge Physician: Tawanna Cooler McDiarmid, MD  Admission Diagnoses: Choledocholithiasis  Discharge Diagnoses:  1. Choledocholithiasis 2. Biliary colic  3. S/p laparoscopic cholecystectomy  Admission Condition: fair  Discharged Condition: good  Indication for Admission: nausea, vomiting and worsening epigastric abdominal pain.  Hospital Course:  36 yo female with h/o epigastric discomfort after eating for 2 years, who presented with worsening abdominal pain, nausea and vomiting and found to have common bile duct stone. 1. Biliary colic and choledocholithiasis: US showed large common bile duct stone, stones and sludge in the gallbladder but no evidence of acute cholecystitis. LFT's were elevated at 479 AST and 496 ALT on admission which trended down to 52 and 223 respectively. Gastroenterology was consulted and felt that since labs were trending down, an ERCP would not be needed. Surgery was consulted that recommended laparoscopic cholecsytectomy. Laparoscopic cholecystectomy with intraoperative cholangiogram was performed without complications. Postoperatively, patient tolerated a regular diet without nausea, vomiting and good pain control on minimal amount of vicodin.   Consults: GI and general surgery  Significant Diagnostic Studies:   ABDOMEN - 1 VIEW 11/14/11 Comparison: None.  Findings: No dilated loops of large or small bowel. No pathologic  calcifications. Rounded vascular calcification in left lower  pelvis. No acute abnormality.  IMPRESSION:  Normal abdominal radiograph.  COMPLETE ABDOMINAL ULTRASOUND 11/14/11 Comparison: Abdomen film of 11/14/2011  Findings:  Gallbladder: The gallbladder is visualized and multiple gallstones  and gallbladder sludge are present. The  largest gallstone measures  2.5 cm in maximum diameter. There is no pain over the gallbladder  with compression currently.  Common bile duct: The common bile duct measures up to 7.7 cm in  diameter, and a distal common bile duct calculus cannot be  excluded. Correlation with liver function tests is recommended.  Liver: The liver has a normal echogenic pattern. No ductal  dilatation is seen.  IVC: Appears normal.  Pancreas: No focal abnormality seen.  Spleen: The spleen is normal measuring 4.3 cm sagittally.  Right Kidney: No hydronephrosis is seen. The right kidney  measures 10.5 cm sagittally.  Left Kidney: No hydronephrosis is noted. The left kidney measures  10.7 cm.  Abdominal aorta: The abdominal aorta is normal in caliber.  IMPRESSION:  1. Multiple gallstones, the largest measuring 2.5 cm. No present  evidence of acute cholecystitis is noted by ultrasound.  2. Slightly prominent common bile duct. Cannot exclude distal  common bile duct calculus.   INTRAOPERATIVE CHOLANGIOGRAM 11/17/11 Technique: Cholangiographic images from the C-arm fluoroscopic  device were submitted for interpretation post-operatively. Please  see the procedural report for the amount of contrast and the  fluoroscopy time utilized.  Comparison: None.  Findings: There are no filling defects within the cystic duct  remnant, common duct, or visualized intrahepatic ducts. Contrast  flows into the duodenum. There is no extravasation of contrast. No  strictures are identified.  11 seconds of fluoroscopy time were utilized.  IMPRESSION:  There are no fixed filling defects, strictures, or extravasation.  CBC    Component Value Date/Time   WBC 8.4 11/19/2011 0546   WBC 7.5 11/14/2011 1351   RBC 4.50 11/19/2011 0546   RBC 4.74 11/14/2011 1351   HGB 12.2 11/19/2011 0546   HGB 12.6 11/14/2011 1351   HCT 37.2 11/19/2011 0546   HCT 39.4  11/14/2011 1351   PLT 268 11/19/2011 0546   MCV 82.7 11/19/2011 0546   MCV 83.2 11/14/2011  1351   MCH 27.1 11/19/2011 0546   MCH 26.6* 11/14/2011 1351   MCHC 32.8 11/19/2011 0546   MCHC 32.0 11/14/2011 1351   RDW 13.4 11/19/2011 0546   CMP     Component Value Date/Time   NA 138 11/19/2011 0546   K 3.7 11/19/2011 0546   CL 101 11/19/2011 0546   CO2 27 11/19/2011 0546   GLUCOSE 89 11/19/2011 0546   BUN 10 11/19/2011 0546   CREATININE 0.61 11/19/2011 0546   CREATININE 0.66 11/14/2011 1343   CALCIUM 9.2 11/19/2011 0546   PROT 7.5 11/17/2011 0800   ALBUMIN 3.6 11/17/2011 0800   AST 52* 11/17/2011 0800   ALT 223* 11/17/2011 0800   ALKPHOS 254* 11/17/2011 0800   BILITOT 0.7 11/17/2011 0800   GFRNONAA >90 11/19/2011 0546   GFRAA >90 11/19/2011 0546   Lipase: 40 (11/14/11), 46 (11/16/11), 52 (11/17/11)  Treatments: IV fluids  Discharge Exam: Filed Vitals:   11/18/11 1500 11/18/11 2115 11/19/11 0528 11/19/11 1351  BP: 120/70 110/76 99/65 106/73  Pulse: 60 57 58 67  Temp: 98.3 F (36.8 C) 98.3 F (36.8 C) 97.6 F (36.4 C) 98.6 F (37 C)  TempSrc: Oral Oral Oral   Resp: 16 15 15 18   Height:      Weight:      SpO2: 98% 97% 98% 98%   General: Hispanic female, no acute distress, comfortably sitting in bed eating full breakfast. Lungs: Normal respiratory effort, chest expands symmetrically. Lungs are clear to auscultation, no crackles or wheezes.  Heart - Regular rate and rhythm. No murmurs, gallops or rubs.  Extremities: Non-tender, No cyanosis, edema, or deformity noted.  Abdomen: incision sites clean and dry, no erythema, no tenderness to palpation, no rebound, no guarding, soft, present bowel sounds.    Disposition: 01-Home or Self Care  Patient Instructions:  Medication List  As of 11/19/2011  9:46 PM   TAKE these medications         fexofenadine 180 MG tablet   Commonly known as: ALLEGRA   Take 180 mg by mouth daily.      HYDROcodone-acetaminophen 5-325 MG per tablet   Commonly known as: NORCO   Take 1-2 tablets by mouth every 4 (four) hours as needed.           Activity: no heavy lifting  for 2 weeks Diet: regular diet Wound Care: keep wound clean and dry  Follow-up Information    Follow up with LAYTON, BRIAN DAVID, DO. Schedule an appointment as soon as possible for a visit in 2 weeks.   Contact information:   [address] [place] [address] [phone]       Follow up with [facility]. Schedule an appointment as soon as possible for a visit in 5 days. (call your primary care doctor to make a follow up appointment for later this week. )    Contact information:   [address] Laurell Josephs [address] [place] [address] [phone]        Signed: [facility] 11/19/2011 9:46 PM

## 2011-11-19 NOTE — Discharge Instructions (Signed)
[facility] ______[facility] °LAPAROSCOPIC SURGERY: POST OP INSTRUCTIONS °Always review your discharge instruction sheet given to you by the facility where your surgery was performed. °IF YOU HAVE DISABILITY OR FAMILY LEAVE FORMS, YOU MUST BRING THEM TO THE OFFICE FOR PROCESSING.   °DO NOT GIVE THEM TO YOUR DOCTOR. ° °1. A prescription for pain medication may be given to you upon discharge.  Take your pain medication as prescribed, if needed.  If narcotic pain medicine is not needed, then you may take acetaminophen (Tylenol) or ibuprofen (Advil) as needed. °2. Take your usually prescribed medications unless otherwise directed. °3. If you need a refill on your pain medication, please contact your pharmacy.  They will contact our office to request authorization. Prescriptions will not be filled after 5pm or on week-ends. °4. You should follow a light diet the first few days after arrival home, such as soup and crackers, etc.  Be sure to include lots of fluids daily. °5. Most patients will experience some swelling and bruising in the area of the incisions.  Ice packs will help.  Swelling and bruising can take several days to resolve.  °6. It is common to experience some constipation if taking pain medication after surgery.  Increasing fluid intake and taking a stool softener (such as Colace) will usually help or prevent this problem from occurring.  A mild laxative (Milk of Magnesia or Miralax) should be taken according to package instructions if there are no bowel movements after 48 hours. °7. Unless discharge instructions indicate otherwise, you may remove your bandages 24-48 hours after surgery, and you may shower at that time.  You may have steri-strips (small skin tapes) in place directly over the incision.  These strips should be left on the skin for 7-10 days.  If your surgeon used skin glue on the incision, you may shower in 24 hours.  The glue will flake off over the next 2-3 weeks.  Any sutures or  staples will be removed at the office during your follow-up visit. °8. ACTIVITIES:  You may resume regular (light) daily activities beginning the next day--such as daily self-care, walking, climbing stairs--gradually increasing activities as tolerated.  You may have sexual intercourse when it is comfortable.  Refrain from any heavy lifting or straining until approved by your doctor. °a. You may drive when you are no longer taking prescription pain medication, you can comfortably wear a seatbelt, and you can safely maneuver your car and apply brakes. °b. RETURN TO WORK:  __________________________________________________________ °9. You should see your doctor in the office for a follow-up appointment approximately 2-3 weeks after your surgery.  Make sure that you call for this appointment within a day or two after you arrive home to insure a convenient appointment time. °10. OTHER INSTRUCTIONS: __________________________________________________________________________________________________________________________ __________________________________________________________________________________________________________________________ °WHEN TO CALL YOUR DOCTOR: °1. Fever over 101.0 °2. Inability to urinate °3. Continued bleeding from incision. °4. Increased pain, redness, or drainage from the incision. °5. Increasing abdominal pain ° °The clinic staff is available to answer your questions during regular business hours.  Please don’t hesitate to call and ask to speak to one of the nurses for clinical concerns.  If you have a medical emergency, go to the nearest emergency room or call 911.  A surgeon from [facility] is always on call at the hospital. °[address], [address], [place], [place]  [address] ? [address] [address], [place], [place]   [address] °[phone] ? [phone] ? FAX [phone] °Web site:   [email or web address] °

## 2011-11-20 ENCOUNTER — Encounter (HOSPITAL_COMMUNITY): Payer: Self-pay | Admitting: General Surgery

## 2011-11-20 NOTE — Discharge Summary (Signed)
I discussed with Dr Losq.  I agree with their plans documented in their discharge note. 

## 2011-11-27 ENCOUNTER — Telehealth (INDEPENDENT_AMBULATORY_CARE_PROVIDER_SITE_OTHER): Payer: Self-pay | Admitting: General Surgery

## 2011-11-27 NOTE — Telephone Encounter (Signed)
Post op appt made for 12/04/11 @ 3:30 w/Dr. Biagio Quint.

## 2011-11-29 ENCOUNTER — Encounter (INDEPENDENT_AMBULATORY_CARE_PROVIDER_SITE_OTHER): Payer: Self-pay

## 2011-12-04 ENCOUNTER — Encounter (INDEPENDENT_AMBULATORY_CARE_PROVIDER_SITE_OTHER): Payer: Self-pay | Admitting: General Surgery

## 2011-12-04 ENCOUNTER — Ambulatory Visit (INDEPENDENT_AMBULATORY_CARE_PROVIDER_SITE_OTHER): Payer: BC Managed Care – PPO | Admitting: General Surgery

## 2011-12-04 VITALS — BP 120/74 | HR 88 | Temp 98.6°F | Ht 61.0 in | Wt 160.2 lb

## 2011-12-04 DIAGNOSIS — Z4889 Encounter for other specified surgical aftercare: Secondary | ICD-10-CM

## 2011-12-04 DIAGNOSIS — Z5189 Encounter for other specified aftercare: Secondary | ICD-10-CM

## 2011-12-04 LAB — HEPATIC FUNCTION PANEL
ALT: 17 U/L (ref 0–35)
Alkaline Phosphatase: 87 U/L (ref 39–117)
Bilirubin, Direct: 0.1 mg/dL (ref 0.0–0.3)
Indirect Bilirubin: 0.5 mg/dL (ref 0.0–0.9)
Total Protein: 7.8 g/dL (ref 6.0–8.3)

## 2011-12-04 NOTE — Patient Instructions (Signed)
Call back at [phone] for lab results.

## 2011-12-04 NOTE — Progress Notes (Signed)
Subjective:     Patient ID: [name], female   DOB: 20-Feb-1976, 36 y.o.   MRN: [id number]  HPI Doing well s/p lap chole for suspected choledocholithiasis.  Path benign.  She feels well, and tolerating diet and pain improved.  Review of Systems     Objective:   Physical Exam Abdomen is soft, nt, nd, incisions without infection    Assessment:     S/p lap chole- doing well She is clinically well and is happy with the results.  I have recommended that we repeat a set of LFT's to ensure that they have normalized.    Plan:     Activity and diet as tolerated and f/u after LFT's

## 2011-12-11 ENCOUNTER — Encounter (INDEPENDENT_AMBULATORY_CARE_PROVIDER_SITE_OTHER): Payer: Self-pay

## 2012-01-16 ENCOUNTER — Ambulatory Visit (INDEPENDENT_AMBULATORY_CARE_PROVIDER_SITE_OTHER): Payer: BC Managed Care – PPO | Admitting: Family Medicine

## 2012-01-16 VITALS — BP 120/70 | HR 74 | Temp 99.6°F | Resp 16 | Ht 61.5 in | Wt 164.8 lb

## 2012-01-16 DIAGNOSIS — N390 Urinary tract infection, site not specified: Secondary | ICD-10-CM

## 2012-01-16 DIAGNOSIS — R350 Frequency of micturition: Secondary | ICD-10-CM

## 2012-01-16 DIAGNOSIS — R300 Dysuria: Secondary | ICD-10-CM

## 2012-01-16 LAB — POCT URINALYSIS DIPSTICK
Bilirubin, UA: NEGATIVE
Glucose, UA: NEGATIVE
Ketones, UA: NEGATIVE
Nitrite, UA: NEGATIVE
Protein, UA: NEGATIVE
Spec Grav, UA: 1.025
Urobilinogen, UA: 0.2
pH, UA: 5

## 2012-01-16 LAB — POCT UA - MICROSCOPIC ONLY
Casts, Ur, LPF, POC: NEGATIVE
Crystals, Ur, HPF, POC: NEGATIVE
Mucus, UA: NEGATIVE
Yeast, UA: NEGATIVE

## 2012-01-16 MED ORDER — FLUCONAZOLE 150 MG PO TABS: 150.0000 mg | ORAL_TABLET | Freq: Once | ORAL | Status: AC

## 2012-01-16 MED ORDER — CIPROFLOXACIN HCL 250 MG PO TABS: 250.0000 mg | ORAL_TABLET | Freq: Two times a day (BID) | ORAL | Status: AC

## 2012-01-16 NOTE — Progress Notes (Signed)
36 yo woman with 3 days of dysuria and vaginal itching.  No fever, sweats, hematuria.  Has some upper back pain.  No N,V Last UTI was 1 year ago. LMP:  6/12 No chance of [pregnancy H/o kidney stones  Objective:   NAD  No CVAT   Assessment:  UTI  P:

## 2012-01-16 NOTE — Patient Instructions (Addendum)

## 2012-01-18 LAB — URINE CULTURE
Colony Count: NO GROWTH
Organism ID, Bacteria: NO GROWTH

## 2012-05-24 ENCOUNTER — Ambulatory Visit (INDEPENDENT_AMBULATORY_CARE_PROVIDER_SITE_OTHER): Payer: BC Managed Care – PPO | Admitting: Emergency Medicine

## 2012-05-24 ENCOUNTER — Ambulatory Visit: Payer: BC Managed Care – PPO

## 2012-05-24 VITALS — BP 116/78 | HR 84 | Temp 98.7°F | Resp 18 | Ht 62.5 in | Wt 172.0 lb

## 2012-05-24 DIAGNOSIS — M25519 Pain in unspecified shoulder: Secondary | ICD-10-CM

## 2012-05-24 DIAGNOSIS — J029 Acute pharyngitis, unspecified: Secondary | ICD-10-CM

## 2012-05-24 DIAGNOSIS — M542 Cervicalgia: Secondary | ICD-10-CM

## 2012-05-24 MED ORDER — MELOXICAM 7.5 MG PO TABS: ORAL_TABLET | ORAL | Status: DC

## 2012-05-24 MED ORDER — FIRST-DUKES MOUTHWASH MT SUSP: OROMUCOSAL | Status: DC

## 2012-05-24 MED ORDER — CYCLOBENZAPRINE HCL 10 MG PO TABS: ORAL_TABLET | ORAL | Status: DC

## 2012-05-24 NOTE — Progress Notes (Signed)
  Subjective:    Patient ID: [name], female    DOB: May 18, 1976, 36 y.o.   MRN: [id number]  HPI patient enters with 3 issues #1 she wants to know whether she is a candidate to take the pneumonia vaccine problem #2 back pain patient has discomfort on the left side of her neck and left periscapular area. Yesterday she noted a rash which has since resolved. She has a burning sensation in the medial left scapula with discomfort when she raises her left arm. Problem #3 is a sore throat and burning sensation. She has some mild head congestion associated with a burning discomfort in her throat and minimal cough.    Review of Systems     Objective:   Physical Exam patient is alert cooperative in no distress. TMs are clear chest is clear to auscultation and percussion. Examination of the throat reveals minimal redness strep test was taken. Musculoskeletal exam reveals tenderness along the medial scapular border. Deep tendon reflexes are 2+ and symmetrical the upper extremities. Motor strength is 5 out of 5 in all muscle groups.  [facility] reading (PRIMARY) by  Dr. Cleta Alberts left shoulder film is normal. Clavicle film is normal cervical film is normal  Results for orders placed in visit on 01/16/12  POCT UA - MICROSCOPIC ONLY      Component Value Range   WBC, Ur, HPF, POC 0-3     RBC, urine, microscopic 4-8     Bacteria, U Microscopic trace     Mucus, UA neg     Epithelial cells, urine per micros 0-1     Crystals, Ur, HPF, POC neg     Casts, Ur, LPF, POC neg     Yeast, UA neg    POCT URINALYSIS DIPSTICK      Component Value Range   Color, UA yellow     Clarity, UA clear     Glucose, UA neg     Bilirubin, UA neg     Ketones, UA neg     Spec Grav, UA 1.025     Blood, UA large     pH, UA 5.0     Protein, UA neg     Urobilinogen, UA 0.2     Nitrite, UA neg     Leukocytes, UA small (1+)    URINE CULTURE      Component Value Range   Colony Count NO GROWTH     Organism ID, Bacteria NO  GROWTH          Assessment & Plan:  Patient has signs and symptoms of a radiculopathy involving C5 distribution which extends over the left periscapular area. Examination outside tenderness to this area is normal. Her x-rays are normal. We'll treat with an anti-inflammatory medication along with a muscle relaxant at night

## 2012-05-24 NOTE — Patient Instructions (Signed)

## 2012-05-28 ENCOUNTER — Other Ambulatory Visit: Payer: Self-pay | Admitting: Radiology

## 2012-12-22 ENCOUNTER — Ambulatory Visit (INDEPENDENT_AMBULATORY_CARE_PROVIDER_SITE_OTHER): Payer: BC Managed Care – PPO | Admitting: Family Medicine

## 2012-12-22 VITALS — BP 116/70 | HR 82 | Temp 98.7°F | Resp 18 | Ht 62.0 in | Wt 168.0 lb

## 2012-12-22 DIAGNOSIS — R197 Diarrhea, unspecified: Secondary | ICD-10-CM

## 2012-12-22 MED ORDER — METRONIDAZOLE 500 MG PO TABS: 500.0000 mg | ORAL_TABLET | Freq: Two times a day (BID) | ORAL | Status: DC

## 2012-12-22 NOTE — Progress Notes (Signed)
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  Room mate is not sick.   Yesterday had chicken soup, ice cream, and dulce de [place].  Today just had milk and oatmeal.  Objective:  NAD Skin:  Warm and dry Abdomen:  Soft, no HSM, nontender, no guarding or rebound Ext: no edema  Assessment;  Diarrhea - Plan: metroNIDAZOLE (FLAGYL) 500 MG tablet  Elvina Sidle, MD

## 2012-12-23 ENCOUNTER — Telehealth: Payer: Self-pay

## 2012-12-23 ENCOUNTER — Ambulatory Visit (INDEPENDENT_AMBULATORY_CARE_PROVIDER_SITE_OTHER): Payer: BC Managed Care – PPO | Admitting: Family Medicine

## 2012-12-23 VITALS — BP 110/74 | HR 110 | Temp 98.6°F | Resp 16 | Ht 61.5 in | Wt 167.2 lb

## 2012-12-23 DIAGNOSIS — R197 Diarrhea, unspecified: Secondary | ICD-10-CM

## 2012-12-23 LAB — POCT CBC
Granulocyte percent: 84.1 %G — AB (ref 37–80)
HCT, POC: 40.6 % (ref 37.7–47.9)
Hemoglobin: 12.5 g/dL (ref 12.2–16.2)
Lymph, poc: 0.7 (ref 0.6–3.4)
MCH, POC: 27.1 pg (ref 27–31.2)
MCHC: 30.8 g/dL — AB (ref 31.8–35.4)
MCV: 87.8 fL (ref 80–97)
MID (cbc): 0.4 (ref 0–0.9)
MPV: 9.5 fL (ref 0–99.8)
POC Granulocyte: 5.6 (ref 2–6.9)
POC LYMPH PERCENT: 10.2 %L (ref 10–50)
POC MID %: 5.7 %M (ref 0–12)
Platelet Count, POC: 245 10*3/uL (ref 142–424)
RBC: 4.62 M/uL (ref 4.04–5.48)
RDW, POC: 14.1 %
WBC: 6.7 10*3/uL (ref 4.6–10.2)

## 2012-12-23 MED ORDER — CIPROFLOXACIN HCL 250 MG PO TABS: 250.0000 mg | ORAL_TABLET | Freq: Two times a day (BID) | ORAL | Status: DC

## 2012-12-23 NOTE — Telephone Encounter (Signed)
Dr. Milus Glazier   Patient was seen yesterday, she is not any better.    [phone]

## 2012-12-23 NOTE — Progress Notes (Signed)
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 She was seen yesterday and started on Flagyl and the diarrhea has indeed slowed down but she's having intermittent cramps and still some loose stools. She's also been running a fever.  As before, she's not having any blood in her stools. She did however have some vomiting overnight.  Objective: Pleasant middle-aged woman in no acute distress Abdomen: Soft with normal bowel sounds, no HSM, mildly tender diffusely with no guarding or rebound. Skin is warm and dry  Results for orders placed in visit on 12/23/12  POCT CBC      Result Value Range   WBC 6.7  4.6 - 10.2 K/uL   Lymph, poc 0.7  0.6 - 3.4   POC LYMPH PERCENT 10.2  10 - 50 %L   MID (cbc) 0.4  0 - 0.9   POC MID % 5.7  0 - 12 %M   POC Granulocyte 5.6  2 - 6.9   Granulocyte percent 84.1 (*) 37 - 80 %G   RBC 4.62  4.04 - 5.48 M/uL   Hemoglobin 12.5  12.2 - 16.2 g/dL   HCT, POC 16.1  09.6 - 47.9 %   MCV 87.8  80 - 97 fL   MCH, POC 27.1  27 - 31.2 pg   MCHC 30.8 (*) 31.8 - 35.4 g/dL   RDW, POC 04.5     Platelet Count, POC 245  142 - 424 K/uL   MPV 9.5  0 - 99.8 fL   Assessment:  Persistent gastroenteritis  Plan:  Stop the Flagyl Start probiotics and Cipro.  Signed, Elvina Sidle, MD

## 2012-12-23 NOTE — Patient Instructions (Addendum)
Take the Cipro twice a day.  Stop the flagyl Also, start probiotic twice a day (eg Culturelle or align)

## 2012-12-23 NOTE — Telephone Encounter (Signed)
Continues with Diarrhea, please advise. More time or return to clinic?

## 2013-11-14 IMAGING — CR DG ABDOMEN 1V
1 series · 1 of 1 positions shown · non-contrast
Comparison: None.

CLINICAL DATA: Epigastric pain

ABDOMEN - 1 VIEW

[AP]
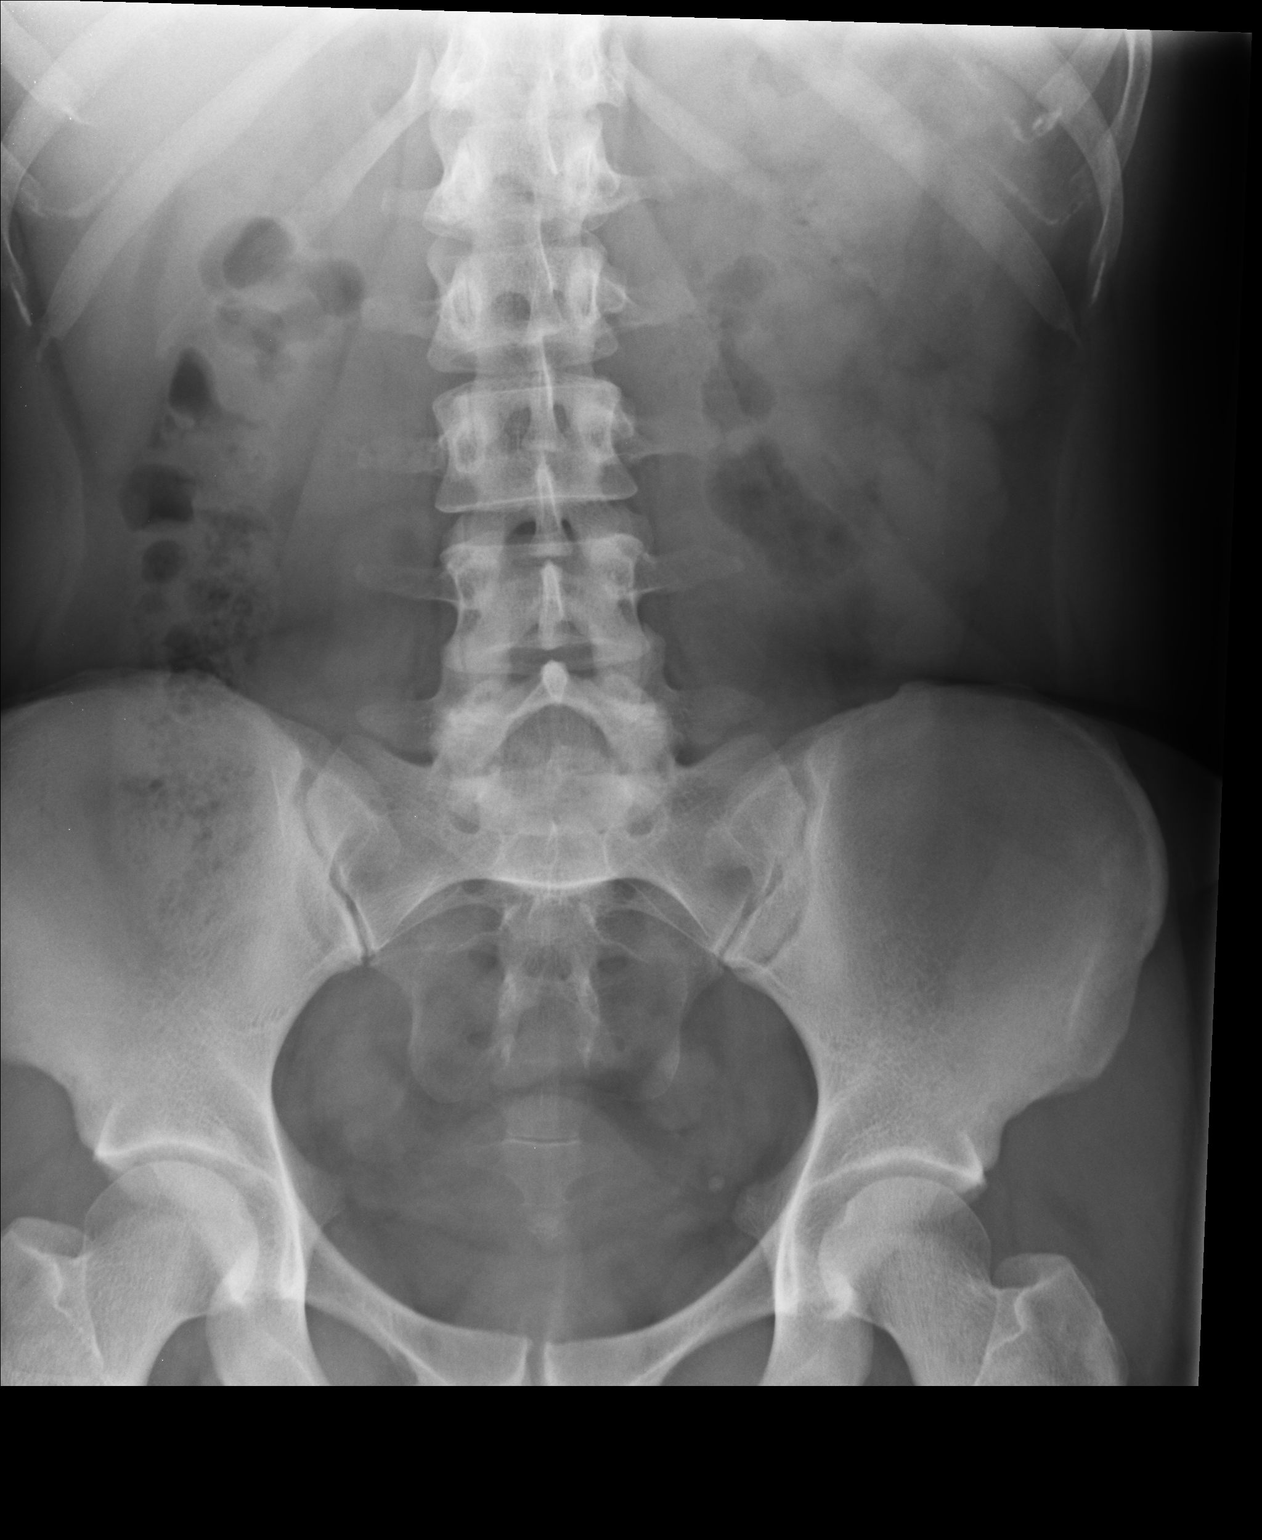

[1 of 1 positions shown; findings below may reference images not displayed]

FINDINGS: No dilated loops of large or small bowel.  No pathologic
calcifications. Rounded vascular calcification in left lower
pelvis.  No acute abnormality.
IMPRESSION: Normal abdominal radiograph.

Clinically significant discrepancy from primary report, if
provided: None

## 2021-10-06 ENCOUNTER — Emergency Department (HOSPITAL_COMMUNITY): Payer: BC Managed Care – PPO

## 2021-10-06 ENCOUNTER — Other Ambulatory Visit: Payer: Self-pay

## 2021-10-06 ENCOUNTER — Encounter (HOSPITAL_COMMUNITY): Payer: Self-pay

## 2021-10-06 ENCOUNTER — Inpatient Hospital Stay (HOSPITAL_COMMUNITY)
Admission: EM | Admit: 2021-10-06 | Discharge: 2021-10-08 | DRG: 661 | Disposition: A | Discharge status: Home / Self Care | Payer: BC Managed Care – PPO | Attending: Internal Medicine | Admitting: Internal Medicine

## 2021-10-06 DIAGNOSIS — G8929 Other chronic pain: Secondary | ICD-10-CM | POA: Diagnosis present

## 2021-10-06 DIAGNOSIS — Z8249 Family history of ischemic heart disease and other diseases of the circulatory system: Secondary | ICD-10-CM | POA: Diagnosis not present

## 2021-10-06 DIAGNOSIS — N132 Hydronephrosis with renal and ureteral calculous obstruction: Secondary | ICD-10-CM | POA: Diagnosis not present

## 2021-10-06 DIAGNOSIS — Z79899 Other long term (current) drug therapy: Secondary | ICD-10-CM

## 2021-10-06 DIAGNOSIS — Z833 Family history of diabetes mellitus: Secondary | ICD-10-CM

## 2021-10-06 DIAGNOSIS — J45909 Unspecified asthma, uncomplicated: Secondary | ICD-10-CM | POA: Diagnosis present

## 2021-10-06 DIAGNOSIS — R109 Unspecified abdominal pain: Secondary | ICD-10-CM | POA: Diagnosis present

## 2021-10-06 DIAGNOSIS — N200 Calculus of kidney: Principal | ICD-10-CM

## 2021-10-06 DIAGNOSIS — Z825 Family history of asthma and other chronic lower respiratory diseases: Secondary | ICD-10-CM

## 2021-10-06 DIAGNOSIS — Z91018 Allergy to other foods: Secondary | ICD-10-CM | POA: Diagnosis not present

## 2021-10-06 DIAGNOSIS — J45998 Other asthma: Secondary | ICD-10-CM

## 2021-10-06 DIAGNOSIS — N136 Pyonephrosis: Principal | ICD-10-CM | POA: Diagnosis present

## 2021-10-06 DIAGNOSIS — I8290 Acute embolism and thrombosis of unspecified vein: Secondary | ICD-10-CM | POA: Insufficient documentation

## 2021-10-06 DIAGNOSIS — M545 Low back pain, unspecified: Secondary | ICD-10-CM | POA: Diagnosis present

## 2021-10-06 DIAGNOSIS — K219 Gastro-esophageal reflux disease without esophagitis: Secondary | ICD-10-CM

## 2021-10-06 DIAGNOSIS — D509 Iron deficiency anemia, unspecified: Secondary | ICD-10-CM

## 2021-10-06 DIAGNOSIS — N390 Urinary tract infection, site not specified: Secondary | ICD-10-CM | POA: Diagnosis present

## 2021-10-06 LAB — COMPREHENSIVE METABOLIC PANEL
ALT: 14 U/L (ref 0–44)
AST: 17 U/L (ref 15–41)
Albumin: 4.5 g/dL (ref 3.5–5.0)
Alkaline Phosphatase: 67 U/L (ref 38–126)
Anion gap: 9 (ref 5–15)
BUN: 15 mg/dL (ref 6–20)
CO2: 21 mmol/L — ABNORMAL LOW (ref 22–32)
Calcium: 9 mg/dL (ref 8.9–10.3)
Chloride: 104 mmol/L (ref 98–111)
Creatinine, Ser: 0.94 mg/dL (ref 0.44–1.00)
GFR, Estimated: >60 mL/min (ref >60)
Glucose, Bld: 100 mg/dL — ABNORMAL HIGH (ref 70–99)
Potassium: 4 mmol/L (ref 3.5–5.1)
Sodium: 134 mmol/L — ABNORMAL LOW (ref 135–145)
Total Bilirubin: 1 mg/dL (ref 0.3–1.2)
Total Protein: 8 g/dL (ref 6.5–8.1)

## 2021-10-06 LAB — URINALYSIS, ROUTINE W REFLEX MICROSCOPIC
Bilirubin Urine: NEGATIVE
Glucose, UA: NEGATIVE mg/dL
Ketones, ur: 5 mg/dL — AB
Nitrite: NEGATIVE
Protein, ur: 30 mg/dL — AB
Specific Gravity, Urine: 1.021 (ref 1.005–1.030)
pH: 5 (ref 5.0–8.0)

## 2021-10-06 LAB — CBC WITH DIFFERENTIAL/PLATELET
Abs Immature Granulocytes: 0.06 10*3/uL (ref 0.00–0.07)
Basophils Absolute: 0 10*3/uL (ref 0.0–0.1)
Basophils Relative: 0 %
Eosinophils Absolute: 0.1 10*3/uL (ref 0.0–0.5)
Eosinophils Relative: 0 %
HCT: 34.8 % — ABNORMAL LOW (ref 36.0–46.0)
Hemoglobin: 11.8 g/dL — ABNORMAL LOW (ref 12.0–15.0)
Immature Granulocytes: 0 %
Lymphocytes Relative: 10 %
Lymphs Abs: 1.4 10*3/uL (ref 0.7–4.0)
MCH: 27.5 pg (ref 26.0–34.0)
MCHC: 33.9 g/dL (ref 30.0–36.0)
MCV: 81.1 fL (ref 80.0–100.0)
Monocytes Absolute: 0.5 10*3/uL (ref 0.1–1.0)
Monocytes Relative: 4 %
Neutro Abs: 12.3 10*3/uL — ABNORMAL HIGH (ref 1.7–7.7)
Neutrophils Relative %: 86 %
Platelets: 308 10*3/uL (ref 150–400)
RBC: 4.29 MIL/uL (ref 3.87–5.11)
RDW: 13.5 % (ref 11.5–15.5)
WBC: 14.5 10*3/uL — ABNORMAL HIGH (ref 4.0–10.5)
nRBC: 0 % (ref 0.0–0.2)

## 2021-10-06 LAB — I-STAT BETA HCG BLOOD, ED (MC, WL, AP ONLY): I-stat hCG, quantitative: <5 m[IU]/mL (ref <5)

## 2021-10-06 LAB — LIPASE, BLOOD: Lipase: 40 U/L (ref 11–51)

## 2021-10-06 MED ORDER — OXYCODONE HCL 5 MG PO TABS: 5.0000 mg | ORAL_TABLET | ORAL | Status: DC | PRN

## 2021-10-06 MED ORDER — ONDANSETRON HCL 4 MG/2ML IJ SOLN: 4.0000 mg | Freq: Four times a day (QID) | INTRAMUSCULAR | Status: DC | PRN

## 2021-10-06 MED ORDER — SENNOSIDES-DOCUSATE SODIUM 8.6-50 MG PO TABS: 1.0000 | ORAL_TABLET | Freq: Every evening | ORAL | Status: DC | PRN

## 2021-10-06 MED ORDER — SODIUM CHLORIDE 0.9 % IV SOLN
1.0000 g | INTRAVENOUS | Status: DC
  Administered 2021-10-07: 1 g via INTRAVENOUS
  Filled 2021-10-06: qty 10

## 2021-10-06 MED ORDER — SODIUM CHLORIDE 0.9 % IV SOLN
1.0000 g | Freq: Once | INTRAVENOUS | Status: AC
  Administered 2021-10-06: 1 g via INTRAVENOUS
  Filled 2021-10-06: qty 10

## 2021-10-06 MED ORDER — LACTATED RINGERS IV SOLN: INTRAVENOUS | Status: DC

## 2021-10-06 MED ORDER — ENOXAPARIN SODIUM 40 MG/0.4ML IJ SOSY
40.0000 mg | PREFILLED_SYRINGE | INTRAMUSCULAR | Status: DC
  Administered 2021-10-07: 40 mg via SUBCUTANEOUS
  Filled 2021-10-06: qty 0.4

## 2021-10-06 MED ORDER — ONDANSETRON 4 MG PO TBDP
4.0000 mg | ORAL_TABLET | Freq: Once | ORAL | Status: AC
  Administered 2021-10-06: 4 mg via ORAL
  Filled 2021-10-06: qty 1

## 2021-10-06 MED ORDER — MORPHINE SULFATE (PF) 2 MG/ML IV SOLN: 1.0000 mg | INTRAVENOUS | Status: DC | PRN

## 2021-10-06 MED ORDER — TAMSULOSIN HCL 0.4 MG PO CAPS
0.4000 mg | ORAL_CAPSULE | Freq: Once | ORAL | Status: AC
  Administered 2021-10-06: 0.4 mg via ORAL
  Filled 2021-10-06: qty 1

## 2021-10-06 MED ORDER — OXYCODONE-ACETAMINOPHEN 5-325 MG PO TABS
1.0000 | ORAL_TABLET | Freq: Once | ORAL | Status: AC
  Administered 2021-10-06: 1 via ORAL
  Filled 2021-10-06: qty 1

## 2021-10-06 MED ORDER — ONDANSETRON HCL 4 MG PO TABS: 4.0000 mg | ORAL_TABLET | Freq: Four times a day (QID) | ORAL | Status: DC | PRN

## 2021-10-06 MED ORDER — ACETAMINOPHEN 650 MG RE SUPP: 650.0000 mg | Freq: Four times a day (QID) | RECTAL | Status: DC | PRN

## 2021-10-06 MED ORDER — ACETAMINOPHEN 325 MG PO TABS
650.0000 mg | ORAL_TABLET | Freq: Four times a day (QID) | ORAL | Status: DC | PRN
Start: 2021-10-06 — End: 2021-10-08

## 2021-10-06 NOTE — Consult Note (Signed)
H&P ?Physician requesting consult: Chaney Malling ? ?Chief Complaint: Right renal pelvic calculus, right ureteral calculus, fever ? ?History of Present Illness: 46 year old female presented to the emergency department with right-sided abdominal discomfort and pain.  She endorses a fever yesterday up to 103.  She however has been afebrile with vital signs stable in the emergency department.  She was feeling lightheaded and dizzy but is and is now feeling much improved.  She is having some mild right-sided abdominal discomfort.  She underwent CT scan of the abdomen and pelvis that revealed a right 1.6 cm renal pelvic calculus and a 3 mm distal right ureteral calculus.  Urinalysis showed rare bacteria, 11-20 WBC, 6-10 RBC.  She had mild leukocytosis with a white blood cell count 14.5.  Creatinine 0.94. ? ?Past Medical History:  ?Diagnosis Date  ? Asthma   ? Chronic lower back pain   ? GERD (gastroesophageal reflux disease)   ? Hepatitis A 07/16/1984  ? History of bronchitis   ? "multiple times"  ? Migraines   ? "not often"  ? Reactive airway disease   ? ?Past Surgical History:  ?Procedure Laterality Date  ? CHOLECYSTECTOMY  11/17/2011  ? Procedure: LAPAROSCOPIC CHOLECYSTECTOMY WITH INTRAOPERATIVE CHOLANGIOGRAM;  Surgeon: Lodema Pilot, DO;  Location: [facility] OR;  Service: General;  Laterality: N/A;  ? NO PAST SURGERIES    ? ? ?Home Medications:  ?(Not in a hospital admission) ? ?Allergies:  ?Allergies  ?Allergen Reactions  ? Other   ?  Raw apple and raw peaches  ? ? ?Family History  ?Problem Relation Age of Onset  ? Hypertension Mother   ? Gallbladder disease Mother 2  ?     gb removed.   ? Diabetes Maternal Grandfather   ? Asthma Father   ? Asthma Paternal Grandmother   ? ?Social History:  reports that she has never smoked. She has never used smokeless tobacco. She reports current alcohol use of about 1.0 standard drink per week. She reports that she does not use drugs. ? ?ROS: ?A complete review of systems was performed.  All  systems are negative except for pertinent findings as noted. ?ROS ? ? ?Physical Exam:  ?Vital signs in last 24 hours: ?Temp:  [98.8 ?F (37.1 ?C)-99.2 ?F (37.3 ?C)] 99.2 ?F (37.3 ?C) (03/24 1919) ?Pulse Rate:  [70-86] 71 (03/24 2045) ?Resp:  [16] 16 (03/24 2045) ?BP: (123-140)/(76-82) 140/82 (03/24 2045) ?SpO2:  [99 %-100 %] 99 % (03/24 2045) ?Weight:  [74.4 kg] 74.4 kg (03/24 1422) ?General:  Alert and oriented, No acute distress ?HEENT: Normocephalic, atraumatic ?Neck: No JVD or lymphadenopathy ?Cardiovascular: Regular rate and rhythm ?Lungs: Regular rate and effort ?Abdomen: Soft, nontender, nondistended, no abdominal masses ?Back: No CVA tenderness ?Extremities: No edema ?Neurologic: Grossly intact ? ?Laboratory Data:  ?Results for orders placed or performed during the hospital encounter of 10/06/21 (from the past 24 hour(s))  ?CBC with Differential     Status: Abnormal  ? Collection Time: 10/06/21  2:39 PM  ?Result Value Ref Range  ? WBC 14.5 (H) 4.0 - 10.5 K/uL  ? RBC 4.29 3.87 - 5.11 MIL/uL  ? Hemoglobin 11.8 (L) 12.0 - 15.0 g/dL  ? HCT 34.8 (L) 36.0 - 46.0 %  ? MCV 81.1 80.0 - 100.0 fL  ? MCH 27.5 26.0 - 34.0 pg  ? MCHC 33.9 30.0 - 36.0 g/dL  ? RDW 13.5 11.5 - 15.5 %  ? Platelets 308 150 - 400 K/uL  ? nRBC 0.0 0.0 - 0.2 %  ?  Neutrophils Relative % 86 %  ? Neutro Abs 12.3 (H) 1.7 - 7.7 K/uL  ? Lymphocytes Relative 10 %  ? Lymphs Abs 1.4 0.7 - 4.0 K/uL  ? Monocytes Relative 4 %  ? Monocytes Absolute 0.5 0.1 - 1.0 K/uL  ? Eosinophils Relative 0 %  ? Eosinophils Absolute 0.1 0.0 - 0.5 K/uL  ? Basophils Relative 0 %  ? Basophils Absolute 0.0 0.0 - 0.1 K/uL  ? Immature Granulocytes 0 %  ? Abs Immature Granulocytes 0.06 0.00 - 0.07 K/uL  ?Comprehensive metabolic panel     Status: Abnormal  ? Collection Time: 10/06/21  2:39 PM  ?Result Value Ref Range  ? Sodium 134 (L) 135 - 145 mmol/L  ? Potassium 4.0 3.5 - 5.1 mmol/L  ? Chloride 104 98 - 111 mmol/L  ? CO2 21 (L) 22 - 32 mmol/L  ? Glucose, Bld 100 (H) 70 - 99 mg/dL   ? BUN 15 6 - 20 mg/dL  ? Creatinine, Ser 0.94 0.44 - 1.00 mg/dL  ? Calcium 9.0 8.9 - 10.3 mg/dL  ? Total Protein 8.0 6.5 - 8.1 g/dL  ? Albumin 4.5 3.5 - 5.0 g/dL  ? AST 17 15 - 41 U/L  ? ALT 14 0 - 44 U/L  ? Alkaline Phosphatase 67 38 - 126 U/L  ? Total Bilirubin 1.0 0.3 - 1.2 mg/dL  ? GFR, Estimated >60 >60 mL/min  ? Anion gap 9 5 - 15  ?Lipase, blood     Status: None  ? Collection Time: 10/06/21  2:39 PM  ?Result Value Ref Range  ? Lipase 40 11 - 51 U/L  ?I-Stat beta hCG blood, ED     Status: None  ? Collection Time: 10/06/21  2:44 PM  ?Result Value Ref Range  ? I-stat hCG, quantitative <5.0 <5 mIU/mL  ? Comment 3          ?Urinalysis, Routine w reflex microscopic Urine, Clean Catch     Status: Abnormal  ? Collection Time: 10/06/21  7:39 PM  ?Result Value Ref Range  ? Color, Urine YELLOW YELLOW  ? APPearance HAZY (A) CLEAR  ? Specific Gravity, Urine 1.021 1.005 - 1.030  ? pH 5.0 5.0 - 8.0  ? Glucose, UA NEGATIVE NEGATIVE mg/dL  ? Hgb urine dipstick LARGE (A) NEGATIVE  ? Bilirubin Urine NEGATIVE NEGATIVE  ? Ketones, ur 5 (A) NEGATIVE mg/dL  ? Protein, ur 30 (A) NEGATIVE mg/dL  ? Nitrite NEGATIVE NEGATIVE  ? Leukocytes,Ua SMALL (A) NEGATIVE  ? RBC / HPF 6-10 0 - 5 RBC/hpf  ? WBC, UA 11-20 0 - 5 WBC/hpf  ? Bacteria, UA RARE (A) NONE SEEN  ? Squamous Epithelial / LPF 0-5 0 - 5  ? Mucus PRESENT   ? ?No results found for this or any previous visit (from the past 240 hour(s)). ?Creatinine: ?Recent Labs  ?  10/06/21 ?1439  ?CREATININE 0.94  ? ?CT scan personally reviewed and is detailed in the history of present illness ? ?Impression/Assessment:  ?Right renal and ureteral calculi ?Urinary tract infection ?Renal colic ?Ureteral obstruction secondary to calculus ? ?Plan:  ?Patient is feeling much improved with no fever and her vital signs are stable.  However, she does self-report a fever at home and has a mild leukocytosis.  She also has a large right renal pelvic calculus in addition to the right ureteral calculus.  I  discussed the options with her and I recommended placement of a right ureteral stent in the morning, earlier  if she starts to develop fever or change in vital signs.  She will then need a ureteroscopy in 1 to 2 weeks.  I put in an order for urine culture.  She has received ceftriaxone.  Please make n.p.o. at midnight and we will plan for OR in the morning. ? ?Ray Church, III ?10/06/2021, 9:02 PM  ? ?

## 2021-10-06 NOTE — ED Provider Triage Note (Signed)
Emergency Medicine Provider Triage Evaluation Note ? ?[name] , a 46 y.o. female  was evaluated in triage.  Pt complains of right flank pain and right abd pain. Reports associated nvd. Seen at urgent care pta and sent here for further w/u. Noted to have hematuria, but no uti.  ? ?Review of Systems  ?Positive: Abd pain, nvd, chills, hematuria ?Negative: cough ? ?Physical Exam  ?BP 123/76 (BP Location: Left Arm)   Pulse 86   Temp 98.8 ?F (37.1 ?C) (Oral)   Resp 16   Ht 5\' 2"  (1.575 m)   Wt 74.4 kg   LMP 09/10/2021 (Approximate)   SpO2 100%   BMI 30.00 kg/m?  ?Gen:   Awake, no distress   ?Resp:  Normal effort  ?MSK:   Moves extremities without difficulty  ?Other:  Abd soft and nontender, no cva ttp ? ?Medical Decision Making  ?Medically screening exam initiated at 2:37 PM.  Appropriate orders placed.  Loan Oguin was informed that the remainder of the evaluation will be completed by another provider, this initial triage assessment does not replace that evaluation, and the importance of remaining in the ED until their evaluation is complete. ? ? ?  ?Oretha Ellis, PA-C ?10/06/21 1437 ? ?

## 2021-10-06 NOTE — ED Triage Notes (Addendum)
Patient c/o right flank pain that radiates into the right lower abdomen since 0930 today. Patientl c/o urinary frequency. Patient went to an UC today and was sent to the ED for further evaluation. ? ?Patient states she had an episode of N/V prior to having right flank and abdominal pain. ?

## 2021-10-06 NOTE — ED Notes (Signed)
Patient has a light green and lavender in the main lab 

## 2021-10-06 NOTE — Hospital Course (Addendum)
[name] is a 46 y.o. female with medical history significant for seasonal asthma, GERD, iron deficiency anemia who is admitted with obstructive right renal and ureteral calculi with associated UTI.  Urology plan for right ureteral stent placement 3/25. ? ?CT renal protocol: Mild right hydro with hydronephrosis with a 3 mm stone in the distal right ureter.  In addition, there are right renal calculi with a large right renal pelvic stone measuring up to 1.6 cm.  Right perinephric edema. ? ?Underwent stent placement 3/25. Stable overnight. Plan to discharge home today.  ?

## 2021-10-06 NOTE — ED Provider Notes (Signed)
?[facility]-EMERGENCY DEPT ?Provider Note ? ? ?CSN: [id number] ?Arrival date & time: 10/06/21  1358 ? ?  ? ?History ? ?Chief Complaint  ?Patient presents with  ? Abdominal Pain  ? Flank Pain  ? ? ?[name] is a 46 y.o. female. ? ?HPI ? ?46 year old female with a history of hepatitis A, asthma, chronic lower back pain, GERD, bronchitis, migraines, reactive airway disease, who presents the emergency department today for evaluation of abdominal and flank pain.  States that she started having right-sided flank pain that radiated around to the right lower quadrant earlier today.  This was associated with nausea, vomiting, urinary frequency and hematuria.  She also states she had fevers at home up to 103 F.  She was seen at urgent care prior to arrival and sent here for further evaluation ? ?Home Medications ?Prior to Admission medications   ?Medication Sig Start Date End Date Taking? Authorizing Provider  ?Ascorbic Acid (VITAMIN C) 1000 MG tablet Take 1,000 mg by mouth daily.    [provider]  ?ciprofloxacin (CIPRO) 250 MG tablet Take 1 tablet (250 mg total) by mouth 2 (two) times daily. 12/23/12   Elvina SidleLauenstein, Kurt, MD  ?VENTOLIN HFA 108 (90 BASE) MCG/ACT inhaler  09/12/11   [provider]  ?   ? ?Allergies    ?Other   ? ?Review of Systems   ?Review of Systems ?See HPI for pertinent positives or negatives. ? ? ?Physical Exam ?Updated Vital Signs ?BP 140/82   Pulse 71   Temp 99.2 ?F (37.3 ?C) (Oral)   Resp 16   Ht 5\' 2"  (1.575 m)   Wt 74.4 kg   LMP 09/10/2021 (Approximate)   SpO2 99%   BMI 30.00 kg/m?  ?Physical Exam ?Vitals and nursing note reviewed.  ?Constitutional:   ?   General: She is not in acute distress. ?   Appearance: She is well-developed.  ?HENT:  ?   Head: Normocephalic and atraumatic.  ?Eyes:  ?   Conjunctiva/sclera: Conjunctivae normal.  ?Cardiovascular:  ?   Rate and Rhythm: Normal rate and regular rhythm.  ?   Heart sounds: Normal heart sounds.  No murmur heard. ?Pulmonary:  ?   Effort: Pulmonary effort is normal. No respiratory distress.  ?   Breath sounds: Normal breath sounds. No wheezing, rhonchi or rales.  ?Abdominal:  ?   General: Bowel sounds are normal.  ?   Palpations: Abdomen is soft.  ?   Tenderness: There is abdominal tenderness in the right lower quadrant. There is right CVA tenderness. There is no guarding or rebound.  ?Musculoskeletal:     ?   General: No swelling.  ?   Cervical back: Neck supple.  ?Skin: ?   General: Skin is warm and dry.  ?   Capillary Refill: Capillary refill takes less than 2 seconds.  ?Neurological:  ?   Mental Status: She is alert.  ?Psychiatric:     ?   Mood and Affect: Mood normal.  ? ? ? ?ED Results / Procedures / Treatments   ?Labs ?(all labs ordered are listed, but only abnormal results are displayed) ?Labs Reviewed  ?URINALYSIS, ROUTINE W REFLEX MICROSCOPIC - Abnormal; Notable for the following components:  ?    Result Value  ? APPearance HAZY (*)   ? Hgb urine dipstick LARGE (*)   ? Ketones, ur 5 (*)   ? Protein, ur 30 (*)   ? Leukocytes,Ua SMALL (*)   ? Bacteria, UA RARE (*)   ?  All other components within normal limits  ?CBC WITH DIFFERENTIAL/PLATELET - Abnormal; Notable for the following components:  ? WBC 14.5 (*)   ? Hemoglobin 11.8 (*)   ? HCT 34.8 (*)   ? Neutro Abs 12.3 (*)   ? All other components within normal limits  ?COMPREHENSIVE METABOLIC PANEL - Abnormal; Notable for the following components:  ? Sodium 134 (*)   ? CO2 21 (*)   ? Glucose, Bld 100 (*)   ? All other components within normal limits  ?LIPASE, BLOOD  ?I-STAT BETA HCG BLOOD, ED ([facility], [facility], [facility] ONLY)  ? ? ?EKG ?None ? ?Radiology ?CT Renal Stone Study ? ?Result Date: 10/06/2021 ?CLINICAL DATA:  Flank pain, kidney stone suspected. EXAM: CT ABDOMEN AND PELVIS WITHOUT CONTRAST TECHNIQUE: Multidetector CT imaging of the abdomen and pelvis was performed following the standard protocol without IV contrast. RADIATION DOSE REDUCTION: This exam was  performed according to the departmental dose-optimization program which includes automated exposure control, adjustment of the mA and/or kV according to patient size and/or use of iterative reconstruction technique. COMPARISON:  None. FINDINGS: Lower chest: Lung bases are clear with some motion artifact. Hepatobiliary: Normal appearance of the liver.  Cholecystectomy. Pancreas: Unremarkable. No pancreatic ductal dilatation or surrounding inflammatory changes. Spleen: Normal in size without focal abnormality. Adrenals/Urinary Tract: Normal adrenal glands. Normal appearance of left kidney without stones or hydronephrosis. Mild right hydronephrosis with right perinephric edema. Large right renal pelvic stone that measures 1.6 x 0.8 x 1.2 cm. Additional small stones in the right kidney lower pole. Mild dilatation of the right ureter with a 3 mm stone in the distal right ureter just proximal to the right ureterovesical junction. Normal appearance of the urinary bladder. Stomach/Bowel: No evidence for bowel dilatation or focal bowel inflammation. Normal appearance of the stomach. Vascular/Lymphatic: No significant vascular findings are present. No enlarged abdominal or pelvic lymph nodes. Reproductive: Uterus and bilateral adnexa are unremarkable. Other: Negative for ascites. Negative for free air. Mild irregularity along the anterior abdominal wall near the umbilicus could be related to postoperative changes. Musculoskeletal: Disc space narrowing at L5-S1. No acute bone abnormality. IMPRESSION: 1. Mild right hydroureteronephrosis with a 3 mm stone in the distal right ureter. In addition, there are right renal calculi with a large right renal pelvic stone measuring up to 1.6 cm. Right perinephric edema. Electronically Signed   By: Richarda Overlie M.D.   On: 10/06/2021 16:23   ? ?Procedures ?Procedures  ? ? ?Medications Ordered in ED ?Medications  ?cefTRIAXone (ROCEPHIN) 1 g in sodium chloride 0.9 % 100 mL IVPB (has no  administration in time range)  ?oxyCODONE-acetaminophen (PERCOCET/ROXICET) 5-325 MG per tablet 1 tablet (1 tablet Oral Given 10/06/21 2014)  ?ondansetron (ZOFRAN-ODT) disintegrating tablet 4 mg (4 mg Oral Given 10/06/21 2014)  ?tamsulosin (FLOMAX) capsule 0.4 mg (0.4 mg Oral Given 10/06/21 2014)  ? ? ?ED Course/ Medical Decision Making/ A&P ?  ?                        ?Medical Decision Making ?Amount and/or Complexity of Data Reviewed ?Labs: ordered. ?Radiology: ordered. ? ?Risk ?Prescription drug management. ? ? ?This patient presents to the ED for concern of flank pain, abd pain, this involves an extensive number of treatment options, and is a complaint that carries with it a high risk of complications and morbidity.  The differential diagnosis includes but is not limited to gastritis/PUD, enteritis/duodenitis, appendicitis, cholelithiasis/cholecystitis, cholangitis, pancreatitis, ruptured viscus, colitis, diverticulitis,  proctitis, cystitis, pyelonephritis, ureteral colic, aortic dissection, aortic aneurysm. In women, ectopic pregnancy, pelvic inflammatory disease, ovarian cysts, and tubo-ovarian abscess were also considered.  ? ?Comorbidities that complicate the patient evaluation: ?Patient?s presentation is complicated by their history of asthma, back pain ? ?Additional history obtained: ?Records reviewed Care Everywhere/External Records and Primary Care Documents ? ?Lab Tests: ?I Ordered, and personally interpreted labs.  The pertinent results include:   ?CBC with mild leukocytosis, mild anemia ?CMP with mild hyponatremia, normal creatinine and LFTs ?Lipase negative ?Beta-hCG negative ?UA with hematuria, small leuks, 6-10 RBCs, 11-20 WBCs and rare bacteria.  No nitrites noted. ? ?Imaging Studies ordered: ?I ordered, independently visualized, and interpreted imaging which showed  ?CT renal -  1. Mild right hydroureteronephrosis with a 3 mm stone in the distal right ureter. In addition, there are right renal  calculi with a large right renal pelvic stone measuring up to 1.6 cm. Right perinephric edema.  ? ?I agree with the radiologist interpretation ? ?Medicines ordered and prescription drug management: ?I ordered medication includ

## 2021-10-06 NOTE — Assessment & Plan Note (Addendum)
UTI ?-CT renal stone: mild right hydroureteronephrosis with 3 mm stone in distal right ureter, right renal calculi with a large right renal pelvic stone measuring up to 1.6 cm. ?-Urology consulted, underwent  ureteral stent placement 3/25 ?-Treated with  IV ceftriaxone. ?-urine culture: insignificant growth 10,000 colonies.  ?-plan to discharge home with 7 days of keflex ?

## 2021-10-06 NOTE — ED Notes (Signed)
Pt drank 240 ml water with no issues ?

## 2021-10-06 NOTE — H&P (Signed)
?History and Physical  ? ? ?[name] GQB:[id number] DOB: 1975-12-15 DOA: 10/06/2021 ? ?PCP: Andreas Blower., MD  ?Patient coming from: Home ? ?I have personally briefly reviewed patient's old medical records in [facility] Link ? ?Chief Complaint: Fevers, right flank pain ? ?HPI: ?[name] is a 46 y.o. female with medical history significant for seasonal asthma, GERD, iron deficiency anemia who presented to the ED for evaluation of right flank pain and fevers at home. ? ?Patient was in her usual state of health until around 0930 on 3/24 when she developed sudden severe right flank pain.  She had associated nausea and vomiting.  Pain would radiate to her right back and right lower quadrant of the abdomen.  She has been having fevers up to 103 ?F at home with chills and diaphoresis.  She has been afebrile while in the ED.  She has also had increased urinary frequency without dysuria.  She does report similar but much milder right flank discomfort about 1 month ago which would resolve after she would drink plenty of water. ? ?ED Course  Labs/Imaging on admission: I have personally reviewed following labs and imaging studies. ? ?Initial vitals showed BP 139/80, pulse 71, RR 16, temp 99.2 ?F, SPO2 100% on room air. ? ?Labs show WBC 14.5, hemoglobin 11.8, platelets 308,000, sodium 134, potassium 4.0, bicarb 21, BUN 15, creatinine 0.94, serum glucose 100, LFTs within normal limits, i-STAT beta-hCG <5.0. ? ?Urinalysis shows negative nitrites, small leukocytes, 6-10 RBC/hpf, 11-20 WBC/hpf, rare bacteria on microscopy.  Urine culture ordered and pending. ? ?CT renal stone study showed mild right hydroureteronephrosis with a 3 mm stone in the distal right ureter.  Right renal calculi with a large right renal pelvic stone measuring up to 1.6 cm and right perinephric edema also seen. ? ?EDP consulted on-call urology, Dr. Alvester Morin, who evaluated patient at bedside.  Recommendation was for medical  admission with plan for ureteral stent placement in the morning.  The hospitalist service was consulted to admit for further evaluation and management. ? ?Review of Systems: All systems reviewed and are negative except as documented in history of present illness above. ? ? ?Past Medical History:  ?Diagnosis Date  ? Asthma   ? Chronic lower back pain   ? GERD (gastroesophageal reflux disease)   ? Hepatitis A 07/16/1984  ? History of bronchitis   ? "multiple times"  ? Migraines   ? "not often"  ? Reactive airway disease   ? ? ?Past Surgical History:  ?Procedure Laterality Date  ? CHOLECYSTECTOMY  11/17/2011  ? Procedure: LAPAROSCOPIC CHOLECYSTECTOMY WITH INTRAOPERATIVE CHOLANGIOGRAM;  Surgeon: Lodema Pilot, DO;  Location: [facility] OR;  Service: General;  Laterality: N/A;  ? NO PAST SURGERIES    ? ? ?Social History: ? reports that she has never smoked. She has never used smokeless tobacco. She reports current alcohol use of about 1.0 standard drink per week. She reports that she does not use drugs. ? ?Allergies  ?Allergen Reactions  ? Other Swelling  ?  Raw apple, raw peaches, raw cherry  ? ? ?Family History  ?Problem Relation Age of Onset  ? Hypertension Mother   ? Gallbladder disease Mother 46  ?     gb removed.   ? Diabetes Maternal Grandfather   ? Asthma Father   ? Asthma Paternal Grandmother   ? ? ? ?Prior to Admission medications   ?Medication Sig Start Date End Date Taking? Authorizing Provider  ?Ascorbic Acid (VITAMIN C) 1000  MG tablet Take 1,000 mg by mouth daily.    [provider]  ?ciprofloxacin (CIPRO) 250 MG tablet Take 1 tablet (250 mg total) by mouth 2 (two) times daily. 12/23/12   Elvina Sidle, MD  ?VENTOLIN HFA 108 (90 BASE) MCG/ACT inhaler  09/12/11   [provider]  ? ? ?Physical Exam: ?Vitals:  ? 10/06/21 1422 10/06/21 1650 10/06/21 1919 10/06/21 2045  ?BP:  137/76 139/80 140/82  ?Pulse:  70 71 71  ?Resp:  16 16 16   ?Temp:   99.2 ?F (37.3 ?C)   ?TempSrc:   Oral   ?SpO2:  99% 100% 99%   ?Weight: 74.4 kg     ?Height: 5\' 2"  (1.575 m)     ? ?Constitutional: Resting in bed, NAD, calm, comfortable ?Eyes: PERRL, lids and conjunctivae normal ?ENMT: Mucous membranes are moist. Posterior pharynx clear of any exudate or lesions.Normal dentition.  ?Neck: normal, supple, no masses. ?Respiratory: clear to auscultation bilaterally, no wheezing, no crackles. Normal respiratory effort. No accessory muscle use.  ?Cardiovascular: Regular rate and rhythm, no murmurs / rubs / gallops. No extremity edema. 2+ pedal pulses. ?Abdomen: Mild RLQ tenderness, no masses palpated. No hepatosplenomegaly.  ?Musculoskeletal: no clubbing / cyanosis. No joint deformity upper and lower extremities. Good ROM, no contractures. Normal muscle tone.  ?Skin: no rashes, lesions, ulcers. No induration ?Neurologic: CN 2-12 grossly intact. Sensation intact. Strength 5/5 in all 4.  ?Psychiatric: Normal judgment and insight. Alert and oriented x 3. Normal mood.  ? ?EKG: Not performed. ? ?Assessment/Plan ?Principal Problem: ?  Hydronephrosis with renal and ureteral calculus obstruction ?Active Problems: ?  UTI (urinary tract infection) ?  ?[name] is a 46 y.o. female with medical history significant for seasonal asthma, GERD, iron deficiency anemia who is admitted with obstructive right renal and ureteral calculi with associated UTI.  Urology plan for right ureteral stent placement 3/25. ? ?Assessment and Plan: ?* Hydronephrosis with renal and ureteral calculus obstruction ?UTI ?CT renal stone study shows mild right hydroureteronephrosis with 3 mm stone in distal right ureter, right renal calculi with a large right renal pelvic stone measuring up to 1.6 cm. ?-Urology consulted, plan for right ureteral stent placement 3/25 ?-Keep n.p.o. after midnight ?-Continue IV ceftriaxone, follow urine culture ?-Continue IV fluid hydration overnight ?-Continue analgesics and antiemetics as needed ? ?DVT prophylaxis: enoxaparin (LOVENOX)  injection 40 mg Start: 10/07/21 2200 ?Code Status: Full code ?Family Communication: Discussed with patient, she has discussed with close friends. ?Disposition Plan: From home and likely discharge to home pending urological intervention ?Consults called: Urology ?Severity of Illness: ?The appropriate patient status for this patient is INPATIENT. Inpatient status is judged to be reasonable and necessary in order to provide the required intensity of service to ensure the patient's safety. The patient's presenting symptoms, physical exam findings, and initial radiographic and laboratory data in the context of their chronic comorbidities is felt to place them at high risk for further clinical deterioration. Furthermore, it is not anticipated that the patient will be medically stable for discharge from the hospital within 2 midnights of admission.  ? ?* I certify that at the point of admission it is my clinical judgment that the patient will require inpatient hospital care spanning beyond 2 midnights from the point of admission due to high intensity of service, high risk for further deterioration and high frequency of surveillance required.*  ?4/25 MD ?[facility] ? ?If 7PM-7AM, please contact night-coverage ?[email or web address] ? ?10/06/2021, 10:14 PM  ?

## 2021-10-07 ENCOUNTER — Inpatient Hospital Stay (HOSPITAL_COMMUNITY): Payer: BC Managed Care – PPO | Admitting: Certified Registered Nurse Anesthetist

## 2021-10-07 ENCOUNTER — Encounter (HOSPITAL_COMMUNITY): Payer: Self-pay | Admitting: Internal Medicine

## 2021-10-07 ENCOUNTER — Inpatient Hospital Stay (HOSPITAL_COMMUNITY): Payer: BC Managed Care – PPO

## 2021-10-07 ENCOUNTER — Encounter (HOSPITAL_COMMUNITY)
Admission: EM | Disposition: A | Discharge status: Home / Self Care | Payer: Self-pay | Source: Home / Self Care | Attending: Internal Medicine

## 2021-10-07 DIAGNOSIS — J45998 Other asthma: Secondary | ICD-10-CM

## 2021-10-07 DIAGNOSIS — K219 Gastro-esophageal reflux disease without esophagitis: Secondary | ICD-10-CM

## 2021-10-07 DIAGNOSIS — D509 Iron deficiency anemia, unspecified: Secondary | ICD-10-CM

## 2021-10-07 DIAGNOSIS — N132 Hydronephrosis with renal and ureteral calculous obstruction: Secondary | ICD-10-CM | POA: Diagnosis not present

## 2021-10-07 HISTORY — PX: CYSTOSCOPY W/ URETERAL STENT PLACEMENT: SHX1429

## 2021-10-07 LAB — CBC
HCT: 32 % — ABNORMAL LOW (ref 36.0–46.0)
Hemoglobin: 10.3 g/dL — ABNORMAL LOW (ref 12.0–15.0)
MCH: 26.6 pg (ref 26.0–34.0)
MCHC: 32.2 g/dL (ref 30.0–36.0)
MCV: 82.7 fL (ref 80.0–100.0)
Platelets: 253 10*3/uL (ref 150–400)
RBC: 3.87 MIL/uL (ref 3.87–5.11)
RDW: 13.8 % (ref 11.5–15.5)
WBC: 7.6 10*3/uL (ref 4.0–10.5)
nRBC: 0 % (ref 0.0–0.2)

## 2021-10-07 LAB — BASIC METABOLIC PANEL
Anion gap: 6 (ref 5–15)
BUN: 23 mg/dL — ABNORMAL HIGH (ref 6–20)
CO2: 24 mmol/L (ref 22–32)
Calcium: 8.6 mg/dL — ABNORMAL LOW (ref 8.9–10.3)
Chloride: 106 mmol/L (ref 98–111)
Creatinine, Ser: 1.17 mg/dL — ABNORMAL HIGH (ref 0.44–1.00)
GFR, Estimated: 59 mL/min — ABNORMAL LOW (ref >60)
Glucose, Bld: 102 mg/dL — ABNORMAL HIGH (ref 70–99)
Potassium: 3.5 mmol/L (ref 3.5–5.1)
Sodium: 136 mmol/L (ref 135–145)

## 2021-10-07 LAB — HIV ANTIBODY (ROUTINE TESTING W REFLEX): HIV Screen 4th Generation wRfx: NONREACTIVE

## 2021-10-07 SURGERY — CYSTOSCOPY, WITH RETROGRADE PYELOGRAM AND URETERAL STENT INSERTION
Anesthesia: General | Laterality: Right

## 2021-10-07 MED ORDER — FENTANYL CITRATE (PF) 100 MCG/2ML IJ SOLN
INTRAMUSCULAR | Status: AC
  Filled 2021-10-07: qty 2

## 2021-10-07 MED ORDER — LIDOCAINE HCL (CARDIAC) PF 100 MG/5ML IV SOSY
PREFILLED_SYRINGE | INTRAVENOUS | Status: DC | PRN
  Administered 2021-10-07: 70 mg via INTRAVENOUS

## 2021-10-07 MED ORDER — PROPOFOL 10 MG/ML IV BOLUS
INTRAVENOUS | Status: DC | PRN
Start: 2021-10-07 — End: 2021-10-07
  Administered 2021-10-07: 160 mg via INTRAVENOUS

## 2021-10-07 MED ORDER — MIDAZOLAM HCL 2 MG/2ML IJ SOLN
INTRAMUSCULAR | Status: DC | PRN
  Administered 2021-10-07: 2 mg via INTRAVENOUS

## 2021-10-07 MED ORDER — ENSURE MAX PROTEIN PO LIQD
11.0000 [oz_av] | Freq: Every day | ORAL | Status: DC
  Administered 2021-10-07: 11 [oz_av] via ORAL
  Filled 2021-10-07 (×2): qty 330

## 2021-10-07 MED ORDER — MIDAZOLAM HCL 2 MG/2ML IJ SOLN
INTRAMUSCULAR | Status: AC
Start: 2021-10-07 — End: ?
  Filled 2021-10-07: qty 2

## 2021-10-07 MED ORDER — IPRATROPIUM-ALBUTEROL 0.5-2.5 (3) MG/3ML IN SOLN: 3.0000 mL | Freq: Four times a day (QID) | RESPIRATORY_TRACT | Status: DC | PRN

## 2021-10-07 MED ORDER — LACTATED RINGERS IV SOLN: INTRAVENOUS | Status: AC

## 2021-10-07 MED ORDER — IOHEXOL 300 MG/ML  SOLN
INTRAMUSCULAR | Status: DC | PRN
Start: 2021-10-07 — End: 2021-10-07
  Administered 2021-10-07: 20 mL

## 2021-10-07 MED ORDER — BOOST / RESOURCE BREEZE PO LIQD CUSTOM
1.0000 | ORAL | Status: DC
Start: 2021-10-08 — End: 2021-10-08
  Filled 2021-10-07: qty 1

## 2021-10-07 MED ORDER — ADULT MULTIVITAMIN W/MINERALS CH
1.0000 | ORAL_TABLET | Freq: Every day | ORAL | Status: DC
  Administered 2021-10-08: 1 via ORAL
  Filled 2021-10-07: qty 1

## 2021-10-07 MED ORDER — STERILE WATER FOR IRRIGATION IR SOLN
Status: DC | PRN
  Administered 2021-10-07: 3000 mL

## 2021-10-07 MED ORDER — FENTANYL CITRATE (PF) 100 MCG/2ML IJ SOLN
INTRAMUSCULAR | Status: DC | PRN
  Administered 2021-10-07: 25 ug via INTRAVENOUS
  Administered 2021-10-07: 50 ug via INTRAVENOUS
  Administered 2021-10-07: 25 ug via INTRAVENOUS

## 2021-10-07 MED ORDER — AMISULPRIDE (ANTIEMETIC) 5 MG/2ML IV SOLN: 10.0000 mg | Freq: Once | INTRAVENOUS | Status: DC | PRN

## 2021-10-07 MED ORDER — 0.9 % SODIUM CHLORIDE (POUR BTL) OPTIME
TOPICAL | Status: DC | PRN
  Administered 2021-10-07: 1000 mL

## 2021-10-07 MED ORDER — DEXAMETHASONE SODIUM PHOSPHATE 10 MG/ML IJ SOLN
INTRAMUSCULAR | Status: DC | PRN
  Administered 2021-10-07: 5 mg via INTRAVENOUS

## 2021-10-07 MED ORDER — ONDANSETRON HCL 4 MG/2ML IJ SOLN
INTRAMUSCULAR | Status: DC | PRN
  Administered 2021-10-07: 4 mg via INTRAVENOUS

## 2021-10-07 MED ORDER — FENTANYL CITRATE PF 50 MCG/ML IJ SOSY: 25.0000 ug | PREFILLED_SYRINGE | INTRAMUSCULAR | Status: DC | PRN

## 2021-10-07 SURGICAL SUPPLY — 12 items
BAG URO CATCHER STRL LF (MISCELLANEOUS) ×2 IMPLANT
CATH URETL OPEN END 6FR 70 (CATHETERS) ×2 IMPLANT
CLOTH BEACON ORANGE TIMEOUT ST (SAFETY) ×2 IMPLANT
GLOVE SURG ENC MOIS LTX SZ7.5 (GLOVE) ×2 IMPLANT
GOWN STRL REUS W/ TWL XL LVL3 (GOWN DISPOSABLE) ×1 IMPLANT
GOWN STRL REUS W/TWL XL LVL3 (GOWN DISPOSABLE) ×1
GUIDEWIRE STR DUAL SENSOR (WIRE) ×2 IMPLANT
MANIFOLD NEPTUNE II (INSTRUMENTS) ×2 IMPLANT
PACK CYSTO (CUSTOM PROCEDURE TRAY) ×2 IMPLANT
STENT URET 6FRX24 CONTOUR (STENTS) ×1 IMPLANT
TUBING CONNECTING 10 (TUBING) ×2 IMPLANT
TUBING UROLOGY SET (TUBING) IMPLANT

## 2021-10-07 NOTE — Anesthesia Postprocedure Evaluation (Signed)
Anesthesia Post Note ? ?Patient: [name] ? ?Procedure(s) Performed: CYSTOSCOPY WITH RETROGRADE PYELOGRAM/URETERAL STENT PLACEMENT (Right) ? ?  ? ?Patient location during evaluation: PACU ?Anesthesia Type: General ?Level of consciousness: sedated and patient cooperative ?Pain management: pain level controlled ?Vital Signs Assessment: post-procedure vital signs reviewed and stable ?Respiratory status: spontaneous breathing ?Cardiovascular status: stable ?Anesthetic complications: no ? ? ?No notable events documented. ? ?Last Vitals:  ?Vitals:  ? 10/07/21 1243 10/07/21 1258  ?BP: 117/77 125/76  ?Pulse: (!) 56 (!) 52  ?Resp: 12 20  ?Temp:  36.6 ?C  ?SpO2: 98% 100%  ?  ?Last Pain:  ?Vitals:  ? 10/07/21 1258  ?TempSrc: Oral  ?PainSc:   ? ? ?  ?  ?  ?  ?  ?  ? ?Lewie Loron ? ? ? ? ?

## 2021-10-07 NOTE — Progress Notes (Signed)
?  Progress Note ? ? ?Patient: [name] YIA:[id number] DOB: 09-26-1975 DOA: 10/06/2021     1 ?DOS: the patient was seen and examined on 10/07/2021 ?  ?Brief hospital course: ?Wilbur Labuda is a 46 y.o. female with medical history significant for seasonal asthma, GERD, iron deficiency anemia who is admitted with obstructive right renal and ureteral calculi with associated UTI.  Urology plan for right ureteral stent placement 3/25. ? ?CT renal protocol: Mild right hydro with hydronephrosis with a 3 mm stone in the distal right ureter.  In addition, there are right renal calculi with a large right renal pelvic stone measuring up to 1.6 cm.  Right perinephric edema. ? ?Assessment and Plan: ?* Hydronephrosis with renal and ureteral calculus obstruction ?UTI ?-CT renal stone: mild right hydroureteronephrosis with 3 mm stone in distal right ureter, right renal calculi with a large right renal pelvic stone measuring up to 1.6 cm. ?-Urology consulted, plan for right ureteral stent placement 3/25 ?-Continue IV ceftriaxone. ?-Follow urine culture ?-Continue with IV fluids.  ? ?Iron deficiency anemia ?Resume iron supplement when infection source is controlled.  ? ?Seasonal asthma ?Continue with albuterol PRN.  ? ? ? ? ?  ? ?Subjective: She report mild Left Lower quadrant abdominal discomfort.  ? ? ? ?Physical Exam: ?Vitals:  ? 10/07/21 1215 10/07/21 1230 10/07/21 1243 10/07/21 1258  ?BP: 108/70 120/82 117/77 125/76  ?Pulse: 74 84 (!) 56 (!) 52  ?Resp: 12 12 12 20   ?Temp:  98.7 ?F (37.1 ?C)  97.8 ?F (36.6 ?C)  ?TempSrc:    Oral  ?SpO2: 98% 99% 98% 100%  ?Weight:      ?Height:      ? ?General; NAD ?Lungs; CTA ?Abdomen; soft, nt, nd ?Extremities; no edema ? ?Data Reviewed: ? ?Cbc and bmet ? ?Family Communication: care discussed with patient.  ? ?Disposition: ?Status is: Inpatient ?Remains inpatient appropriate because: needs ureteral stent placment, IV fluids, IV antibiotics.  ? ? Planned Discharge Destination:  Home ? ? ? ?Time spent: 45 minutes ? ?Author: ? , MD ?10/07/2021 1:50 PM ? ?For on call review [email or web address].  ?

## 2021-10-07 NOTE — Anesthesia Procedure Notes (Signed)
Procedure Name: LMA Insertion ?Date/Time: 10/07/2021 11:35 AM ?Performed by: Yolonda Kida, CRNA ?Pre-anesthesia Checklist: Patient identified, Emergency Drugs available, Suction available and Patient being monitored ?Patient Re-evaluated:Patient Re-evaluated prior to induction ?Oxygen Delivery Method: Circle system utilized ?Preoxygenation: Pre-oxygenation with 100% oxygen ?Induction Type: IV induction ?Ventilation: Mask ventilation without difficulty ?LMA: LMA inserted ?LMA Size: 4.0 ?Number of attempts: 1 ?Placement Confirmation: positive ETCO2 and breath sounds checked- equal and bilateral ?Tube secured with: Tape ?Dental Injury: Teeth and Oropharynx as per pre-operative assessment  ? ? ? ? ?

## 2021-10-07 NOTE — Progress Notes (Signed)
Initial Nutrition Assessment ?RD working remotely. ? ?DOCUMENTATION CODES:  ? ?Not applicable ? ?INTERVENTION:  ?- will order Boost Breeze once/day, each supplement provides 250 kcal and 9 grams of protein. ?- will order Ensure Max once/day, each supplement provides 150 kcal and 30 grams of protein. ?- will order 1 tablet Multivitamin with minerals daily ?- complete NFPE when feasible.  ? ? ?NUTRITION DIAGNOSIS:  ? ?Increased nutrient needs related to acute illness, post-op healing as evidenced by estimated needs. ? ?GOAL:  ? ?Patient will meet greater than or equal to 90% of their needs ? ?MONITOR:  ? ?Diet advancement, PO intake, Supplement acceptance, Labs, Weight trends ? ?REASON FOR ASSESSMENT:  ? ?Malnutrition Screening Tool ? ?ASSESSMENT:  ? ?46 y.o. female with medical history of seasonal asthma, GERD, iron deficiency anemia, hx of hepatitis A, migraines, and chronic lower back pain. She presented to the ED due to R flank pain and fevers. Symptoms with sudden onset around 0930 on 3/24 and was associated with N/V. ? ?Patient out of the room to OR. She has not had a meal since admission.  ? ?She has not been seen by a [place] RD at any time in the past.  ? ?Weight yesterday was 162 lb and PTA the most recently documented weight was 167 lb on 11/04/19 at [facility]. This indicates 5 lb weight loss (3% body weight). Unsure of actual time frame for weight loss.  ? ?She was admitted with hydronephrosis with renal and ureteral calculus obstruction and UTI.  ? ? ? ?Labs reviewed; BUN: 23 mg/dl, creatinine: 7.20 mg/dl, Ca: 8.6 mg/dl. ?Medications reviewed. ?IVF; LR @ 125 ml/hr. ?  ? ?NUTRITION - FOCUSED PHYSICAL EXAM: ? ?RD working remotely, patient out of the room. ? ?Diet Order:   ?Diet Order   ? ?       ?  Diet NPO time specified  Diet effective midnight       ?  ? ?  ?  ? ?  ? ? ?EDUCATION NEEDS:  ? ?No education needs have been identified at this time ? ?Skin:  Skin Assessment: Reviewed RN Assessment ? ?Last BM:   PTA/unknown ? ?Height:  ? ?Ht Readings from Last 1 Encounters:  ?10/06/21 5\' 2"  (1.575 m)  ? ? ?Weight:  ? ?Wt Readings from Last 1 Encounters:  ?10/06/21 73.6 kg  ? ? ? ?BMI:  Body mass index is 29.67 kg/m?. ? ?Estimated Nutritional Needs:  ?Kcal:  1850-2050 kcal ?Protein:  90-100 grams ?Fluid:  >/= 2 L/day ? ? ? ? ? ?10/08/21, MS, RD, LDN ?Registered Dietitian II ?Inpatient Clinical Nutrition ?RD pager # and on-call/weekend pager # available in [facility]  ? ?

## 2021-10-07 NOTE — Anesthesia Preprocedure Evaluation (Addendum)
Anesthesia Evaluation  ?Patient identified by MRN, date of birth, ID band ?Patient awake ? ? ? ?Reviewed: ?Allergy & Precautions, NPO status , Patient's Chart, lab work & pertinent test results ? ?Airway ?Mallampati: II ? ?TM Distance: >3 FB ?Neck ROM: Full ? ? ? Dental ? ?(+) Dental Advisory Given, Teeth Intact ?  ?Pulmonary ?asthma ,  ?  ?Pulmonary exam normal ?breath sounds clear to auscultation ? ? ? ? ? ? Cardiovascular ?negative cardio ROS ?Normal cardiovascular exam ?Rhythm:Regular Rate:Normal ? ? ?  ?Neuro/Psych ? Headaches,   ? GI/Hepatic ?GERD  ,(+) Hepatitis -  ?Endo/Other  ?negative endocrine ROS ? Renal/GU ?Renal disease  ? ?  ?Musculoskeletal ?negative musculoskeletal ROS ?(+)  ? Abdominal ?  ?Peds ? Hematology ? ?(+) Blood dyscrasia, anemia ,   ?Anesthesia Other Findings ? ? Reproductive/Obstetrics ? ?  ? ? ? ? ? ? ? ? ? ? ? ? ? ?  ?  ? ? ? ? ? ? ? ?Anesthesia Physical ?Anesthesia Plan ? ?ASA: 3 ? ?Anesthesia Plan: General  ? ?Post-op Pain Management: Minimal or no pain anticipated  ? ?Induction: Intravenous ? ?PONV Risk Score and Plan: 4 or greater and Ondansetron, Dexamethasone, Treatment may vary due to age or medical condition and Midazolam ? ?Airway Management Planned: LMA ? ?Additional Equipment:  ? ?Intra-op Plan:  ? ?Post-operative Plan: Extubation in OR ? ?Informed Consent: I have reviewed the patients History and Physical, chart, labs and discussed the procedure including the risks, benefits and alternatives for the proposed anesthesia with the patient or authorized representative who has indicated his/her understanding and acceptance.  ? ? ? ?Dental advisory given ? ?Plan Discussed with: CRNA ? ?Anesthesia Plan Comments:   ? ? ? ? ?Anesthesia Quick Evaluation ? ?

## 2021-10-07 NOTE — Assessment & Plan Note (Addendum)
Resume iron supplement at discharge.  ?

## 2021-10-07 NOTE — Op Note (Signed)
Operative Note ? ?Preoperative diagnosis:  ?1.  Right renal and ureteral calculus with possible UTI ? ?Post operative diagnosis: ?1.  Right renal and ureteral calculus with possible UTI ? ?Procedure(s): ?1.  Cystoscopy with right retrograde pyelogram and right ureteral stent placement ? ?Surgeon: Modena Slater, MD ? ?Assistants: None ? ?Anesthesia: General ? ?Complications: None immediate ? ?EBL: Minimal ? ?Specimens: ?1.  None ? ?Drains/Catheters: ?1.  6 X 24 double-J ureteral stent ? ?Intraoperative findings: 1.  Normal urethra and bladder 2.  Right retrograde pyelogram revealed a filling defect at the level of the stone with upstream hydroureteronephrosis.  There was a filling defect in the renal pelvis consistent with large calculus there as well. ? ?Indication: 46 year old female with a large right renal pelvis stone and a distal right ureteral calculus presenting with intermittent fever, leukocytosis, and pain.  She presents for ureteral stent placement. ? ?Description of procedure: ? ?The patient was identified and consent was obtained.  The patient was taken to the operating room and placed in the supine position.  The patient was placed under general anesthesia.  Perioperative antibiotics were administered.  The patient was placed in dorsal lithotomy.  Patient was prepped and draped in a standard sterile fashion and a timeout was performed. ? ?A 21 French rigid cystoscope was advanced into the urethra and into the bladder.  The right distal most portion of the ureter was cannulated with an open-ended ureteral catheter.  Retrograde pyelogram was performed with the findings noted above.  A sensor wire was then advanced up to the kidney under fluoroscopic guidance.  A 6 X 24 double-J ureteral stent was advanced up to the kidney under fluoroscopic guidance.  The wire was withdrawn and fluoroscopy confirmed good proximal placement and direct visualization confirmed a good coil within the bladder.  The bladder was  drained and the scope withdrawn.  This concluded the operation.  Patient tolerated procedure well and was stable postoperatively. ? ?Plan: Continue IV antibiotics and transition to oral once cultures return.  She will need to be scheduled for outpatient treatment in 1 to 2 weeks.  I will arrange for that. ? ? ?

## 2021-10-07 NOTE — Transfer of Care (Signed)
Immediate Anesthesia Transfer of Care Note ? ?Patient: Litzi Binning ? ?Procedure(s) Performed: CYSTOSCOPY WITH RETROGRADE PYELOGRAM/URETERAL STENT PLACEMENT (Right) ? ?Patient Location: PACU ? ?Anesthesia Type:General ? ?Level of Consciousness: awake, alert , oriented and patient cooperative ? ?Airway & Oxygen Therapy: Patient Spontanous Breathing and Patient connected to face mask oxygen ? ?Post-op Assessment: Report given to RN and Post -op Vital signs reviewed and stable ? ?Post vital signs: Reviewed and stable ? ?Last Vitals:  ?Vitals Value Taken Time  ?BP 105/66 10/07/21 1212  ?Temp    ?Pulse 77 10/07/21 1213  ?Resp 12 10/07/21 1213  ?SpO2 100 % 10/07/21 1213  ?Vitals shown include unvalidated device data. ? ?Last Pain:  ?Vitals:  ? 10/07/21 0800  ?TempSrc: Oral  ?PainSc: 0-No pain  ?   ? ?  ? ?Complications: No notable events documented. ?

## 2021-10-07 NOTE — Assessment & Plan Note (Signed)
Continue with albuterol PRN.  ?

## 2021-10-08 ENCOUNTER — Encounter (HOSPITAL_COMMUNITY): Payer: Self-pay | Admitting: Urology

## 2021-10-08 DIAGNOSIS — N132 Hydronephrosis with renal and ureteral calculous obstruction: Secondary | ICD-10-CM | POA: Diagnosis not present

## 2021-10-08 LAB — BASIC METABOLIC PANEL
Anion gap: 8 (ref 5–15)
BUN: 21 mg/dL — ABNORMAL HIGH (ref 6–20)
CO2: 23 mmol/L (ref 22–32)
Calcium: 8.8 mg/dL — ABNORMAL LOW (ref 8.9–10.3)
Chloride: 106 mmol/L (ref 98–111)
Creatinine, Ser: 0.85 mg/dL (ref 0.44–1.00)
GFR, Estimated: >60 mL/min (ref >60)
Glucose, Bld: 120 mg/dL — ABNORMAL HIGH (ref 70–99)
Potassium: 3.9 mmol/L (ref 3.5–5.1)
Sodium: 137 mmol/L (ref 135–145)

## 2021-10-08 LAB — URINE CULTURE: Culture: <10000 — AB

## 2021-10-08 MED ORDER — CEPHALEXIN 500 MG PO CAPS
500.0000 mg | ORAL_CAPSULE | Freq: Three times a day (TID) | ORAL | 0 refills | Status: AC
Start: 2021-10-08 — End: 2021-10-15

## 2021-10-08 MED ORDER — ACETAMINOPHEN 325 MG PO TABS: 650.0000 mg | ORAL_TABLET | Freq: Four times a day (QID) | ORAL | 0 refills | Status: AC | PRN

## 2021-10-08 MED ORDER — FERROUS SULFATE 325 (65 FE) MG PO TABS
325.0000 mg | ORAL_TABLET | Freq: Every day | ORAL | 3 refills | Status: AC
Start: 2021-10-08 — End: 2022-10-08

## 2021-10-08 MED ORDER — SENNOSIDES-DOCUSATE SODIUM 8.6-50 MG PO TABS: 1.0000 | ORAL_TABLET | Freq: Every day | ORAL | 0 refills | Status: AC | PRN

## 2021-10-08 MED ORDER — POLYVINYL ALCOHOL 1.4 % OP SOLN
1.0000 [drp] | OPHTHALMIC | Status: DC | PRN
  Administered 2021-10-08: 1 [drp] via OPHTHALMIC
  Filled 2021-10-08: qty 15

## 2021-10-08 NOTE — Discharge Summary (Signed)
?Physician Discharge Summary ?  ?Patient: [name] MRN: [id number] DOB: March 19, 1976  ?Admit date:     10/06/2021  ?Discharge date: 10/08/21  ?Discharge Physician: Jerald Kief A Prabhleen Montemayor  ? ?PCP: Kristopher Glee., MD  ? ?Recommendations at discharge:  ? ?Follow up with Dr Gloriann Loan , urology for further care Kidney stone and stent removal.  ? ?Discharge Diagnoses: ?Principal Problem: ?  Hydronephrosis with renal and ureteral calculus obstruction ?Active Problems: ?  UTI (urinary tract infection) ?  GERD (gastroesophageal reflux disease) ?  Seasonal asthma ?  Iron deficiency anemia ? ?Resolved Problems: ?  * No resolved hospital problems. * ? ?Hospital Course: ?[name] is a 46 y.o. female with medical history significant for seasonal asthma, GERD, iron deficiency anemia who is admitted with obstructive right renal and ureteral calculi with associated UTI.  Urology plan for right ureteral stent placement 3/25. ? ?CT renal protocol: Mild right hydro with hydronephrosis with a 3 mm stone in the distal right ureter.  In addition, there are right renal calculi with a large right renal pelvic stone measuring up to 1.6 cm.  Right perinephric edema. ? ?Underwent stent placement 3/25. Stable overnight. Plan to discharge home today.  ? ?Assessment and Plan: ?* Hydronephrosis with renal and ureteral calculus obstruction ?UTI ?-CT renal stone: mild right hydroureteronephrosis with 3 mm stone in distal right ureter, right renal calculi with a large right renal pelvic stone measuring up to 1.6 cm. ?-Urology consulted, underwent  ureteral stent placement 3/25 ?-Treated with  IV ceftriaxone. ?-urine culture: insignificant growth 10,000 colonies.  ?-plan to discharge home with 7 days of keflex ? ?Iron deficiency anemia ?Resume iron supplement at discharge.  ? ?Seasonal asthma ?Continue with albuterol PRN.  ? ? ? ? ?  ? ? ?Consultants: Dr Gloriann Loan, Urology  ?Procedures performed: cystoscopy, uretheral stent placement.   ?Disposition: Home ?Diet recommendation:  ?Discharge Diet Orders (From admission, onward)  ? ?  Start     Ordered  ? 10/08/21 0000  Diet - low sodium heart healthy       ? 10/08/21 1033  ? ?  ?  ? ?  ? ?Cardiac diet ?DISCHARGE MEDICATION: ?Allergies as of 10/08/2021   ? ?   Reactions  ? Other Swelling  ? Raw apple, raw peaches, raw cherry  ? ?  ? ?  ?Medication List  ?  ? ?STOP taking these medications   ? ?amoxicillin 500 MG capsule ?Commonly known as: AMOXIL ?  ? ?  ? ?TAKE these medications   ? ?acetaminophen 325 MG tablet ?Commonly known as: TYLENOL ?Take 2 tablets (650 mg total) by mouth every 6 (six) hours as needed for mild pain (or Fever >/= 101). ?  ?cephALEXin 500 MG capsule ?Commonly known as: KEFLEX ?Take 1 capsule (500 mg total) by mouth 3 (three) times daily for 7 days. ?Notes to patient: 10/08/2021 evening dose  ?  ?ferrous sulfate 325 (65 FE) MG tablet ?Take 1 tablet (325 mg total) by mouth daily. ?  ?senna-docusate 8.6-50 MG tablet ?Commonly known as: Senokot-S ?Take 1 tablet by mouth daily as needed for mild constipation. ?  ?Ventolin HFA 108 (90 Base) MCG/ACT inhaler ?Generic drug: albuterol ?Inhale 1 puff into the lungs every 4 (four) hours as needed for wheezing or shortness of breath. ?  ?vitamin C 1000 MG tablet ?Take 1,000 mg by mouth daily. ?Notes to patient: 10/08/2021  ?  ? ?  ? ? Follow-up Information   ? ? Link Snuffer  D III, MD Follow up in 1 week(s).   ?Specialty: Urology ?Why: call office to arrange appointment. ?Contact information: ?[address] ?[place] [address] ?[phone] ? ? ?  ?  ? ?  ?  ? ?  ? ?Discharge Exam: ?Filed Weights  ? 10/06/21 1422 10/06/21 2300  ?Weight: 74.4 kg 73.6 kg  ? ?General; NAD ?Lungs; CTA ?Abdomen; soft, nt ? ?Condition at discharge: stable ? ?The results of significant diagnostics from this hospitalization (including imaging, microbiology, ancillary and laboratory) are listed below for reference.  ? ?Imaging Studies: ?DG C-Arm 1-60 Min-No  Report ? ?Result Date: 10/07/2021 ?Fluoroscopy was utilized by the requesting physician.  No radiographic interpretation.  ? ?CT Renal Stone Study ? ?Result Date: 10/06/2021 ?CLINICAL DATA:  Flank pain, kidney stone suspected. EXAM: CT ABDOMEN AND PELVIS WITHOUT CONTRAST TECHNIQUE: Multidetector CT imaging of the abdomen and pelvis was performed following the standard protocol without IV contrast. RADIATION DOSE REDUCTION: This exam was performed according to the departmental dose-optimization program which includes automated exposure control, adjustment of the mA and/or kV according to patient size and/or use of iterative reconstruction technique. COMPARISON:  None. FINDINGS: Lower chest: Lung bases are clear with some motion artifact. Hepatobiliary: Normal appearance of the liver.  Cholecystectomy. Pancreas: Unremarkable. No pancreatic ductal dilatation or surrounding inflammatory changes. Spleen: Normal in size without focal abnormality. Adrenals/Urinary Tract: Normal adrenal glands. Normal appearance of left kidney without stones or hydronephrosis. Mild right hydronephrosis with right perinephric edema. Large right renal pelvic stone that measures 1.6 x 0.8 x 1.2 cm. Additional small stones in the right kidney lower pole. Mild dilatation of the right ureter with a 3 mm stone in the distal right ureter just proximal to the right ureterovesical junction. Normal appearance of the urinary bladder. Stomach/Bowel: No evidence for bowel dilatation or focal bowel inflammation. Normal appearance of the stomach. Vascular/Lymphatic: No significant vascular findings are present. No enlarged abdominal or pelvic lymph nodes. Reproductive: Uterus and bilateral adnexa are unremarkable. Other: Negative for ascites. Negative for free air. Mild irregularity along the anterior abdominal wall near the umbilicus could be related to postoperative changes. Musculoskeletal: Disc space narrowing at L5-S1. No acute bone abnormality.  IMPRESSION: 1. Mild right hydroureteronephrosis with a 3 mm stone in the distal right ureter. In addition, there are right renal calculi with a large right renal pelvic stone measuring up to 1.6 cm. Right perinephric edema. Electronically Signed   By: Markus Daft M.D.   On: 10/06/2021 16:23   ? ?Microbiology: ?Results for orders placed or performed during the hospital encounter of 10/06/21  ?Urine Culture     Status: Abnormal  ? Collection Time: 10/06/21  7:39 PM  ? Specimen: Urine, Clean Catch  ?Result Value Ref Range Status  ? Specimen Description   Final  ?  URINE, CLEAN CATCH ?Performed at [facility], [place] [address] [place], [place] [address] ?  ? Special Requests   Final  ?  NONE ?Performed at [facility], [place] [address], [place], [place] [address] ?  ? Culture (A)  Final  ?  <10,000 COLONIES/mL INSIGNIFICANT GROWTH ?Performed at [facility], [place] [address], [place], [place] [address] ?  ? Report Status 10/08/2021 FINAL  Final  ? ? ?Labs: ?CBC: ?Recent Labs  ?Lab 10/06/21 ?1439 10/07/21 ?0356  ?WBC 14.5* 7.6  ?NEUTROABS 12.3*  --   ?HGB 11.8* 10.3*  ?HCT 34.8* 32.0*  ?MCV 81.1 82.7  ?PLT 308 253  ? ?  Basic Metabolic Panel: ?Recent Labs  ?Lab 10/06/21 ?1439 10/07/21 ?0356 10/08/21 ?0345  ?NA 134* 136 137  ?K 4.0 3.5 3.9  ?CL 104 106 106  ?CO2 21* 24 23  ?GLUCOSE 100* 102* 120*  ?BUN 15 23* 21*  ?CREATININE 0.94 1.17* 0.85  ?CALCIUM 9.0 8.6* 8.8*  ? ?Liver Function Tests: ?Recent Labs  ?Lab 10/06/21 ?1439  ?AST 17  ?ALT 14  ?ALKPHOS 67  ?BILITOT 1.0  ?PROT 8.0  ?ALBUMIN 4.5  ? ?CBG: ?No results for input(s): GLUCAP in the last 168 hours. ? ?Discharge time spent: greater than 30 minutes. ? ?Signed: ?Elmarie Shiley, MD ?[facility] ?10/08/2021 ?

## 2021-10-08 NOTE — Progress Notes (Signed)
Urology Inpatient Progress Report ? ?Kidney stone [N20.0] ?Hydronephrosis with renal and ureteral calculus obstruction [N13.2] ? ?Procedure(s): ?CYSTOSCOPY WITH RETROGRADE PYELOGRAM/URETERAL STENT PLACEMENT ? ?1 Day Post-Op ? ? ?Intv/Subj: ?No acute events overnight. ?Patient is without complaint.  Feeling much improved.  Urine culture with less than 10,000 insignificant growth. ? ?Principal Problem: ?  Hydronephrosis with renal and ureteral calculus obstruction ?Active Problems: ?  UTI (urinary tract infection) ?  GERD (gastroesophageal reflux disease) ?  Seasonal asthma ?  Iron deficiency anemia ? ?Current Facility-Administered Medications  ?Medication Dose Route Frequency Provider Last Rate Last Admin  ? acetaminophen (TYLENOL) tablet 650 mg  650 mg Oral Q6H PRN Lenore Cordia, MD      ? Or  ? acetaminophen (TYLENOL) suppository 650 mg  650 mg Rectal Q6H PRN Lenore Cordia, MD      ? cefTRIAXone (ROCEPHIN) 1 g in sodium chloride 0.9 % 100 mL IVPB  1 g Intravenous Q24H Lenore Cordia, MD   Stopped at 10/07/21 2158  ? enoxaparin (LOVENOX) injection 40 mg  40 mg Subcutaneous Q24H Lenore Cordia, MD   40 mg at 10/07/21 2131  ? feeding supplement (BOOST / RESOURCE BREEZE) liquid 1 Container  1 Container Oral Q24H Regalado, Belkys A, MD      ? ipratropium-albuterol (DUONEB) 0.5-2.5 (3) MG/3ML nebulizer solution 3 mL  3 mL Nebulization Q6H PRN Regalado, Belkys A, MD      ? morphine (PF) 2 MG/ML injection 1 mg  1 mg Intravenous Q3H PRN Lenore Cordia, MD      ? multivitamin with minerals tablet 1 tablet  1 tablet Oral Daily Regalado, Belkys A, MD   1 tablet at 10/08/21 1056  ? ondansetron (ZOFRAN) tablet 4 mg  4 mg Oral Q6H PRN Lenore Cordia, MD      ? Or  ? ondansetron (ZOFRAN) injection 4 mg  4 mg Intravenous Q6H PRN Lenore Cordia, MD      ? oxyCODONE (Oxy IR/ROXICODONE) immediate release tablet 5 mg  5 mg Oral Q4H PRN Lenore Cordia, MD      ? polyvinyl alcohol (LIQUIFILM TEARS) 1.4 % ophthalmic solution 1  drop  1 drop Both Eyes PRN Regalado, Belkys A, MD   1 drop at 10/08/21 0408  ? protein supplement (ENSURE MAX) liquid  11 oz Oral Daily Regalado, Belkys A, MD   11 oz at 10/07/21 2130  ? senna-docusate (Senokot-S) tablet 1 tablet  1 tablet Oral QHS PRN Lenore Cordia, MD      ? ? ? ?Objective: ?Vital: ?Vitals:  ? 10/07/21 1243 10/07/21 1258 10/07/21 2037 10/08/21 0512  ?BP: 117/77 125/76 106/65 110/68  ?Pulse: (!) 56 (!) 52 91 74  ?Resp: 12 20 18 14   ?Temp:  97.8 ?F (36.6 ?C) 98.1 ?F (36.7 ?C) 98.6 ?F (37 ?C)  ?TempSrc:  Oral Oral Oral  ?SpO2: 98% 100% 98% 98%  ?Weight:      ?Height:      ? ?I/Os: ?I/O last 3 completed shifts: ?In: 3793.4 [P.O.:240; I.V.:3453.4; IV Piggyback:100] ?Out: 1300 [Urine:1300] ? ?Physical Exam:  ?General: Patient is in no apparent distress ?Lungs: Normal respiratory effort, chest expands symmetrically. ?GI:The abdomen is soft and nontender without mass. ?Ext: lower extremities symmetric ? ?Lab Results: ?Recent Labs  ?  10/06/21 ?1439 10/07/21 ?0356  ?WBC 14.5* 7.6  ?HGB 11.8* 10.3*  ?HCT 34.8* 32.0*  ? ?Recent Labs  ?  10/06/21 ?1439 10/07/21 ?0356 10/08/21 ?0345  ?NA 134*  136 137  ?K 4.0 3.5 3.9  ?CL 104 106 106  ?CO2 21* 24 23  ?GLUCOSE 100* 102* 120*  ?BUN 15 23* 21*  ?CREATININE 0.94 1.17* 0.85  ?CALCIUM 9.0 8.6* 8.8*  ? ?No results for input(s): LABPT, INR in the last 72 hours. ?No results for input(s): LABURIN in the last 72 hours. ?Results for orders placed or performed during the hospital encounter of 10/06/21  ?Urine Culture     Status: Abnormal  ? Collection Time: 10/06/21  7:39 PM  ? Specimen: Urine, Clean Catch  ?Result Value Ref Range Status  ? Specimen Description   Final  ?  URINE, CLEAN CATCH ?Performed at [facility], [place] [address], [place], [place] [address] ?  ? Special Requests   Final  ?  NONE ?Performed at [facility], [place] [address], [place], [place] [address] ?  ? Culture (A)  Final  ?  <10,000 COLONIES/mL INSIGNIFICANT  GROWTH ?Performed at [facility], [place] [address], [place], [place] [address] ?  ? Report Status 10/08/2021 FINAL  Final  ? ? ?Studies/Results: ?DG C-Arm 1-60 Min-No Report ? ?Result Date: 10/07/2021 ?Fluoroscopy was utilized by the requesting physician.  No radiographic interpretation.  ? ?CT Renal Stone Study ? ?Result Date: 10/06/2021 ?CLINICAL DATA:  Flank pain, kidney stone suspected. EXAM: CT ABDOMEN AND PELVIS WITHOUT CONTRAST TECHNIQUE: Multidetector CT imaging of the abdomen and pelvis was performed following the standard protocol without IV contrast. RADIATION DOSE REDUCTION: This exam was performed according to the departmental dose-optimization program which includes automated exposure control, adjustment of the mA and/or kV according to patient size and/or use of iterative reconstruction technique. COMPARISON:  None. FINDINGS: Lower chest: Lung bases are clear with some motion artifact. Hepatobiliary: Normal appearance of the liver.  Cholecystectomy. Pancreas: Unremarkable. No pancreatic ductal dilatation or surrounding inflammatory changes. Spleen: Normal in size without focal abnormality. Adrenals/Urinary Tract: Normal adrenal glands. Normal appearance of left kidney without stones or hydronephrosis. Mild right hydronephrosis with right perinephric edema. Large right renal pelvic stone that measures 1.6 x 0.8 x 1.2 cm. Additional small stones in the right kidney lower pole. Mild dilatation of the right ureter with a 3 mm stone in the distal right ureter just proximal to the right ureterovesical junction. Normal appearance of the urinary bladder. Stomach/Bowel: No evidence for bowel dilatation or focal bowel inflammation. Normal appearance of the stomach. Vascular/Lymphatic: No significant vascular findings are present. No enlarged abdominal or pelvic lymph nodes. Reproductive: Uterus and bilateral adnexa are unremarkable. Other: Negative for ascites. Negative for free air. Mild irregularity along  the anterior abdominal wall near the umbilicus could be related to postoperative changes. Musculoskeletal: Disc space narrowing at L5-S1. No acute bone abnormality. IMPRESSION: 1. Mild right hydroureteronephrosis with a 3 mm stone in the distal right ureter. In addition, there are right renal calculi with a large right renal pelvic stone measuring up to 1.6 cm. Right perinephric edema. Electronically Signed   By: Markus Daft M.D.   On: 10/06/2021 16:23   ? ?Assessment: ?Procedure(s): ?CYSTOSCOPY WITH RETROGRADE PYELOGRAM/URETERAL STENT PLACEMENT, 1 Day Post-Op  doing well. ? ?Plan: ?Plan for discharge home with Keflex.  Office will contact her in regards to scheduling outpatient ureteroscopy. ? ? ?Link Snuffer, MD ?Urology ?10/08/2021, 11:40 AM  ?

## 2022-07-10 ENCOUNTER — Ambulatory Visit
Admission: RE | Admit: 2022-07-10 | Discharge: 2022-07-10 | Disposition: A | Discharge status: Home / Self Care | Payer: BC Managed Care – PPO | Source: Ambulatory Visit | Attending: Emergency Medicine | Admitting: Emergency Medicine

## 2022-07-10 VITALS — BP 120/85 | HR 103 | Temp 98.1°F | Resp 16

## 2022-07-10 DIAGNOSIS — J329 Chronic sinusitis, unspecified: Secondary | ICD-10-CM

## 2022-07-10 DIAGNOSIS — J111 Influenza due to unidentified influenza virus with other respiratory manifestations: Secondary | ICD-10-CM | POA: Diagnosis not present

## 2022-07-10 DIAGNOSIS — J9801 Acute bronchospasm: Secondary | ICD-10-CM | POA: Diagnosis not present

## 2022-07-10 MED ORDER — IPRATROPIUM BROMIDE 0.06 % NA SOLN: 2.0000 | Freq: Three times a day (TID) | NASAL | 1 refills | Status: AC

## 2022-07-10 MED ORDER — ALBUTEROL SULFATE HFA 108 (90 BASE) MCG/ACT IN AERS: 2.0000 | INHALATION_SPRAY | Freq: Four times a day (QID) | RESPIRATORY_TRACT | 0 refills | Status: AC | PRN

## 2022-07-10 MED ORDER — IBUPROFEN 400 MG PO TABS: 400.0000 mg | ORAL_TABLET | Freq: Three times a day (TID) | ORAL | 0 refills | Status: AC | PRN

## 2022-07-10 MED ORDER — GUAIFENESIN 400 MG PO TABS: ORAL_TABLET | ORAL | 0 refills | Status: AC

## 2022-07-10 NOTE — ED Provider Notes (Signed)
[facility]    CSN: [id number] Arrival date & time: 07/10/22  3419    HISTORY   Chief Complaint  Patient presents with   flu like illness   HPI [name] is a pleasant, 46 y.o. female who presents to urgent care today. Patient complains of fever 100-102, aching of head, muscles and teeth, chills, hands and feet turning purple and blue, sinus pressure, deep yellow/green rhinorrhea, nasal congestion, chest congestion, productive cough and night sweats.  Patient states her symptoms began 5 days ago.  Patient reports rhinorrhea, nasal congestion.  Patient has a slightly elevated heart rate on arrival, vital signs are otherwise normal at this time.  Patient denies nausea, vomiting, diarrhea, sore throat, shortness of breath or cough keeping her awake at night.  Patient states she has been taking acetaminophen.  The history is provided by the patient.   Past Medical History:  Diagnosis Date   Asthma    Chronic lower back pain    GERD (gastroesophageal reflux disease)    Hepatitis A 07/16/1984   History of bronchitis    "multiple times"   Migraines    "not often"   Reactive airway disease    Patient Active Problem List   Diagnosis Date Noted   GERD (gastroesophageal reflux disease) 10/07/2021   Seasonal asthma 10/07/2021   Iron deficiency anemia 10/07/2021   Venous thromboembolism 10/06/2021   Hydronephrosis with renal and ureteral calculus obstruction 10/06/2021   UTI (urinary tract infection) 10/06/2021   S/P cholecystectomy 11/16/2011   Past Surgical History:  Procedure Laterality Date   CHOLECYSTECTOMY  11/17/2011   Procedure: LAPAROSCOPIC CHOLECYSTECTOMY WITH INTRAOPERATIVE CHOLANGIOGRAM;  Surgeon: Lodema Pilot, DO;  Location: [facility];  Service: General;  Laterality: N/A;   CYSTOSCOPY W/ URETERAL STENT PLACEMENT Right 10/07/2021   Procedure: CYSTOSCOPY WITH RETROGRADE PYELOGRAM/URETERAL STENT PLACEMENT;  Surgeon: Crista Elliot, MD;  Location: [facility];  Service: Urology;  Laterality: Right;   NO PAST SURGERIES     OB History   No obstetric history on file.    Home Medications    Prior to Admission medications   Medication Sig Start Date End Date Taking? Authorizing Provider  acetaminophen (TYLENOL) 325 MG tablet Take 2 tablets (650 mg total) by mouth every 6 (six) hours as needed for mild pain (or Fever >/= 101). 10/08/21   Regalado, Belkys A, MD  Ascorbic Acid (VITAMIN C) 1000 MG tablet Take 1,000 mg by mouth daily.    [provider]  ferrous sulfate 325 (65 FE) MG tablet Take 1 tablet (325 mg total) by mouth daily. 10/08/21 10/08/22  Regalado, Belkys A, MD  senna-docusate (SENOKOT-S) 8.6-50 MG tablet Take 1 tablet by mouth daily as needed for mild constipation. 10/08/21   Regalado, Belkys A, MD  VENTOLIN HFA 108 (90 BASE) MCG/ACT inhaler Inhale 1 puff into the lungs every 4 (four) hours as needed for wheezing or shortness of breath. 09/12/11   [provider]    Family History Family History  Problem Relation Age of Onset   Hypertension Mother    Gallbladder disease Mother 66       gb removed.    Diabetes Maternal Grandfather    Asthma Father    Asthma Paternal Grandmother    Social History Social History   Tobacco Use   Smoking status: Never   Smokeless tobacco: Never  Vaping Use   Vaping Use: Never used  Substance Use Topics   Alcohol use: Yes  Alcohol/week: 1.0 standard drink of alcohol    Types: 1 Glasses of wine per week    Comment: social   Drug use: No   Allergies   Other  Review of Systems Review of Systems Pertinent findings revealed after performing a 14 point review of systems has been noted in the history of present illness.  Physical Exam Triage Vital Signs ED Triage Vitals  Enc Vitals Group     BP 05/12/21 0827 (!) 147/82     Pulse Rate 05/12/21 0827 72     Resp 05/12/21 0827 18     Temp 05/12/21 0827 98.3 F (36.8 C)     Temp Source 05/12/21 0827 Oral     SpO2  05/12/21 0827 98 %     Weight --      Height --      Head Circumference --      Peak Flow --      Pain Score 05/12/21 0826 5     Pain Loc --      Pain Edu? --      Excl. in GC? --   No data found.  Updated Vital Signs BP 120/85 (BP Location: Right Arm)   Pulse (!) 103   Temp 98.1 F (36.7 C) (Oral)   Resp 16   SpO2 96%   Physical Exam Vitals and nursing note reviewed.  Constitutional:      General: She is not in acute distress.    Appearance: Normal appearance. She is not ill-appearing.  HENT:     Head: Normocephalic and atraumatic.     Salivary Glands: Right salivary gland is not diffusely enlarged or tender. Left salivary gland is not diffusely enlarged or tender.     Right Ear: Tympanic membrane, ear canal and external ear normal. No drainage. No middle ear effusion. There is no impacted cerumen. Tympanic membrane is not erythematous or bulging.     Left Ear: Tympanic membrane, ear canal and external ear normal. No drainage.  No middle ear effusion. There is no impacted cerumen. Tympanic membrane is not erythematous or bulging.     Nose: Nose normal. No nasal deformity, septal deviation, mucosal edema, congestion or rhinorrhea.     Right Turbinates: Not enlarged, swollen or pale.     Left Turbinates: Not enlarged, swollen or pale.     Right Sinus: No maxillary sinus tenderness or frontal sinus tenderness.     Left Sinus: No maxillary sinus tenderness or frontal sinus tenderness.     Mouth/Throat:     Lips: Pink. No lesions.     Mouth: Mucous membranes are moist. No oral lesions.     Pharynx: Oropharynx is clear. Uvula midline. No posterior oropharyngeal erythema or uvula swelling.     Tonsils: No tonsillar exudate. 0 on the right. 0 on the left.  Eyes:     General: Lids are normal.        Right eye: No discharge.        Left eye: No discharge.     Extraocular Movements: Extraocular movements intact.     Conjunctiva/sclera: Conjunctivae normal.     Right eye: Right  conjunctiva is not injected.     Left eye: Left conjunctiva is not injected.  Neck:     Trachea: Trachea and phonation normal.  Cardiovascular:     Rate and Rhythm: Normal rate and regular rhythm.     Pulses: Normal pulses.     Heart sounds: Normal heart sounds. No murmur heard.  No friction rub. No gallop.  Pulmonary:     Effort: Pulmonary effort is normal. No tachypnea, bradypnea, accessory muscle usage, prolonged expiration, respiratory distress or retractions.     Breath sounds: Normal breath sounds and air entry. No stridor, decreased air movement or transmitted upper airway sounds. No decreased breath sounds, wheezing, rhonchi or rales.     Comments: Bronchospasm with cough Chest:     Chest wall: No tenderness.  Musculoskeletal:        General: Normal range of motion.     Cervical back: Normal range of motion and neck supple. Normal range of motion.  Lymphadenopathy:     Cervical: No cervical adenopathy.  Skin:    General: Skin is warm and dry.     Findings: No erythema or rash.  Neurological:     General: No focal deficit present.     Mental Status: She is alert and oriented to person, place, and time.  Psychiatric:        Mood and Affect: Mood normal.        Behavior: Behavior normal.     Visual Acuity Right Eye Distance:   Left Eye Distance:   Bilateral Distance:    Right Eye Near:   Left Eye Near:    Bilateral Near:     UC Couse / Diagnostics / Procedures:     Radiology No results found.  Procedures Procedures (including critical care time) EKG  Pending results:  Labs Reviewed - No data to display  Medications Ordered in UC: Medications - No data to display  UC Diagnoses / Final Clinical Impressions(s)   I have reviewed the triage vital signs and the nursing notes.  Pertinent labs & imaging results that were available during my care of the patient were reviewed by me and considered in my medical decision making (see chart for details).     Final diagnoses:  Influenza-like illness  Bronchospasm  Rhinosinusitis   Patient advised that she is on the tail end of influenza-like illness, patient advised that we do not have any influenza test at this time and that testing would not change plan of care anyway.  Patient provided with albuterol inhaler for bronchospasm with cough, Mucinex to loosen congestion and advised to switch from Tylenol to ibuprofen to reduce inflammation and help open nasal passages and lower airways. Please see discharge instructions below for further details of plan of care as provided to patient. ED Prescriptions     Medication Sig Dispense Auth. Provider   albuterol (VENTOLIN HFA) 108 (90 Base) MCG/ACT inhaler Inhale 2 puffs into the lungs every 6 (six) hours as needed for wheezing or shortness of breath (Cough). 18 g Theadora RamaMorgan, Asyah Candler Scales, PA-C   guaifenesin (HUMIBID E) 400 MG TABS tablet Take 1 tablet 3 times daily as needed for chest congestion and cough 21 tablet Theadora RamaMorgan, Keon Benscoter Scales, PA-C   ipratropium (ATROVENT) 0.06 % nasal spray Place 2 sprays into both nostrils 3 (three) times daily. As needed for nasal congestion, runny nose 15 mL Theadora RamaMorgan, Hutton Pellicane Scales, PA-C   ibuprofen (ADVIL) 400 MG tablet Take 1 tablet (400 mg total) by mouth every 8 (eight) hours as needed for up to 30 doses. 30 tablet Theadora RamaMorgan, Elby Blackwelder Scales, PA-C      PDMP not reviewed this encounter.  Disposition Upon Discharge:  Condition: stable for discharge home Home: take medications as prescribed; routine discharge instructions as discussed; follow up as advised.  Patient presented with an acute illness with associated systemic symptoms  and significant discomfort requiring urgent management. In my opinion, this is a condition that a prudent lay person (someone who possesses an average knowledge of health and medicine) may potentially expect to result in complications if not addressed urgently such as respiratory distress,  impairment of bodily function or dysfunction of bodily organs.   Routine symptom specific, illness specific and/or disease specific instructions were discussed with the patient and/or caregiver at length.   As such, the patient has been evaluated and assessed, work-up was performed and treatment was provided in alignment with urgent care protocols and evidence based medicine.  Patient/parent/caregiver has been advised that the patient may require follow up for further testing and treatment if the symptoms continue in spite of treatment, as clinically indicated and appropriate.  If the patient was tested for COVID-19, Influenza and/or RSV, then the patient/parent/guardian was advised to isolate at home pending the results of his/her diagnostic coronavirus test and potentially longer if they're positive. I have also advised pt that if his/her COVID-19 test returns positive, it's recommended to self-isolate for at least 10 days after symptoms first appeared AND until fever-free for 24 hours without fever reducer AND other symptoms have improved or resolved. Discussed self-isolation recommendations as well as instructions for household member/close contacts as per the [facility] and [place] DHHS, and also gave patient the COVID packet with this information.  Patient/parent/caregiver has been advised to return to the [facility] or PCP in 3-5 days if no better; to PCP or the Emergency Department if new signs and symptoms develop, or if the current signs or symptoms continue to change or worsen for further workup, evaluation and treatment as clinically indicated and appropriate  The patient will follow up with their current PCP if and as advised. If the patient does not currently have a PCP we will assist them in obtaining one.   The patient may need specialty follow up if the symptoms continue, in spite of conservative treatment and management, for further workup, evaluation, consultation and treatment as clinically indicated  and appropriate.  Patient/parent/caregiver verbalized understanding and agreement of plan as discussed.  All questions were addressed during visit.  Please see discharge instructions below for further details of plan.  Discharge Instructions:   Discharge Instructions      Please read below to learn more about the medications, dosages and frequencies that I recommend to help alleviate your symptoms and to get you feeling better soon:   Atrovent (ipratropium): This is an excellent nasal decongestant spray that does not cause rebound congestion, please instill 2 sprays into each nare with each use.  Please use the spray up to 4 times daily as needed.  I have provided you with a prescription for this medication.     ProAir, Ventolin, Proventil (albuterol): This inhaled medication contains a short acting beta agonist bronchodilator.  This medication works on the smooth muscle that opens and constricts of your airways by relaxing the muscle.  The result of relaxation of the smooth muscle is increased air movement and improved work of breathing.  This is a short acting medication that can be used every 4-6 hours as needed for increased work of breathing, shortness of breath, wheezing and excessive coughing.  I have provided you with a prescription.    Advil, Motrin (ibuprofen): This is a good anti-inflammatory medication which not only addresses aches, pains but also significantly reduces soft tissue inflammation of the upper airways that causes sinus and nasal congestion as well as inflammation of the lower  airways which makes you feel like your breathing is constricted or your cough feel tight.  I recommend that you take 400 mg every 8 hours as needed.      Robitussin, Mucinex (guaifenesin): This is an expectorant.  This helps break up chest congestion and loosen up thick nasal drainage making phlegm and drainage more liquid and therefore easier to remove.  I recommend being 400 mg three times daily as  needed.      Please follow-up within the next 5-7 days either with your primary care provider or urgent care if your symptoms do not resolve.  If you do not have a primary care provider, we will assist you in finding one.        Thank you for visiting urgent care today.  We appreciate the opportunity to participate in your care.       This office note has been dictated using Teaching laboratory technician.  Unfortunately, this method of dictation can sometimes lead to typographical or grammatical errors.  I apologize for your inconvenience in advance if this occurs.  Please do not hesitate to reach out to me if clarification is needed.      Theadora Rama Scales, [place] 07/10/22 [phone]

## 2022-07-10 NOTE — ED Triage Notes (Signed)
Pt present fever, body aches, chills and sweating. Symptoms started on Friday. Pt states having nasal and chest congestion discharge.

## 2022-07-10 NOTE — Discharge Instructions (Signed)
Please read below to learn more about the medications, dosages and frequencies that I recommend to help alleviate your symptoms and to get you feeling better soon:   Atrovent (ipratropium): This is an excellent nasal decongestant spray that does not cause rebound congestion, please instill 2 sprays into each nare with each use.  Please use the spray up to 4 times daily as needed.  I have provided you with a prescription for this medication.     ProAir, Ventolin, Proventil (albuterol): This inhaled medication contains a short acting beta agonist bronchodilator.  This medication works on the smooth muscle that opens and constricts of your airways by relaxing the muscle.  The result of relaxation of the smooth muscle is increased air movement and improved work of breathing.  This is a short acting medication that can be used every 4-6 hours as needed for increased work of breathing, shortness of breath, wheezing and excessive coughing.  I have provided you with a prescription.    Advil, Motrin (ibuprofen): This is a good anti-inflammatory medication which not only addresses aches, pains but also significantly reduces soft tissue inflammation of the upper airways that causes sinus and nasal congestion as well as inflammation of the lower airways which makes you feel like your breathing is constricted or your cough feel tight.  I recommend that you take 400 mg every 8 hours as needed.      Robitussin, Mucinex (guaifenesin): This is an expectorant.  This helps break up chest congestion and loosen up thick nasal drainage making phlegm and drainage more liquid and therefore easier to remove.  I recommend being 400 mg three times daily as needed.      Please follow-up within the next 5-7 days either with your primary care provider or urgent care if your symptoms do not resolve.  If you do not have a primary care provider, we will assist you in finding one.        Thank you for visiting urgent care today.  We  appreciate the opportunity to participate in your care.

## 2022-10-06 ENCOUNTER — Ambulatory Visit
Admission: RE | Admit: 2022-10-06 | Discharge: 2022-10-06 | Disposition: A | Discharge status: Home / Self Care | Payer: BC Managed Care – PPO | Source: Ambulatory Visit | Attending: Family Medicine | Admitting: Family Medicine

## 2022-10-06 VITALS — BP 139/86 | HR 70 | Temp 98.8°F | Resp 16

## 2022-10-06 DIAGNOSIS — H1032 Unspecified acute conjunctivitis, left eye: Secondary | ICD-10-CM

## 2022-10-06 DIAGNOSIS — J069 Acute upper respiratory infection, unspecified: Secondary | ICD-10-CM

## 2022-10-06 MED ORDER — OLOPATADINE HCL 0.2 % OP SOLN: 1.0000 [drp] | Freq: Every day | OPHTHALMIC | 0 refills | Status: AC

## 2022-10-06 NOTE — ED Provider Notes (Addendum)
[facility]   [id number] 10/06/22 Arrival Time: [id number]  ASSESSMENT & PLAN:  1. Acute conjunctivitis of left eye, unspecified acute conjunctivitis type   2. Viral URI    Discussed typical duration of likely viral illness. OTC symptom care as needed. No significant cough. Local eye care discussed.  Discharge Medication List as of 10/06/2022 10:09 AM     START taking these medications   Details  Olopatadine HCl 0.2 % SOLN Apply 1 drop to eye daily., Starting Sat 10/06/2022, Normal        Discharge Instructions      You may use over the counter ibuprofen or acetaminophen as needed.  For a sore throat, over the counter products such as [employer] Peroxyl Mouth Sore Rinse or Chloraseptic Sore Throat Spray may provide some temporary relief.         Follow-up Information     Kristopher Glee., MD.   Specialty: Internal Medicine Why: If worsening or failing to improve as anticipated. Contact information: [id number] [place] [address] [phone]                 Reviewed expectations re: course of current medical issues. Questions answered. Outlined signs and symptoms indicating need for more acute intervention. Understanding verbalized. After Visit Summary given.   SUBJECTIVE: History from: Patient. [name] is a 47 y.o. female. Reports: runny nose, nasal congestion, mild ST; noted yesterday; today with bilateral eye itching and watery drainage; no specific eye pain. Denies: fever and cough. Normal PO intake without n/v/d.  OBJECTIVE:  Vitals:   10/06/22 0935  BP: 139/86  Pulse: 70  Resp: 16  Temp: 98.8 F (37.1 C)  SpO2: 96%    General appearance: alert; no distress Eyes: PERRLA; EOMI; conjunctivae with bilateral 1+ injection and watery drainage HENT: [place]; AT; with nasal congestion Neck: supple  Lungs: speaks full sentences without difficulty; unlabored Extremities: no edema Skin: warm and  dry Neurologic: normal gait Psychological: alert and cooperative; normal mood and affect   Allergies  Allergen Reactions   Other Swelling    Raw apple, raw peaches, raw cherry    Past Medical History:  Diagnosis Date   Asthma    Chronic lower back pain    GERD (gastroesophageal reflux disease)    Hepatitis A 07/16/1984   History of bronchitis    "multiple times"   Migraines    "not often"   Reactive airway disease    Social History   Socioeconomic History   Marital status: Single    Spouse name: Not on file   Number of children: Not on file   Years of education: Not on file   Highest education level: Not on file  Occupational History   Not on file  Tobacco Use   Smoking status: Never   Smokeless tobacco: Never  Vaping Use   Vaping Use: Never used  Substance and Sexual Activity   Alcohol use: Yes    Alcohol/week: 1.0 standard drink of alcohol    Types: 1 Glasses of wine per week    Comment: social   Drug use: No   Sexual activity: Never  Other Topics Concern   Not on file  Social History Narrative   Not on file   Social Determinants of Health   Financial Resource Strain: Not on file  Food Insecurity: Not on file  Transportation Needs: Not on file  Physical Activity: Not on file  Stress: Not on file  Social  Connections: Not on file  Intimate Partner Violence: Not on file   Family History  Problem Relation Age of Onset   Hypertension Mother    Gallbladder disease Mother 68       gb removed.    Diabetes Maternal Grandfather    Asthma Father    Asthma Paternal Grandmother    Past Surgical History:  Procedure Laterality Date   CHOLECYSTECTOMY  11/17/2011   Procedure: LAPAROSCOPIC CHOLECYSTECTOMY WITH INTRAOPERATIVE CHOLANGIOGRAM;  Surgeon: Madilyn Hook, DO;  Location: [facility];  Service: General;  Laterality: N/A;   CYSTOSCOPY W/ URETERAL STENT PLACEMENT Right 10/07/2021   Procedure: CYSTOSCOPY WITH RETROGRADE PYELOGRAM/URETERAL STENT PLACEMENT;  Surgeon:  Lucas Mallow, MD;  Location: [facility];  Service: Urology;  Laterality: Right;   NO PAST SURGERIES       Vanessa Kick, MD 10/06/22 1020    Vanessa Kick, MD 10/06/22 1020

## 2022-10-06 NOTE — Discharge Instructions (Addendum)
You may use over the counter ibuprofen or acetaminophen as needed.  °For a sore throat, over the counter products such as [employer] Peroxyl Mouth Sore Rinse or Chloraseptic Sore Throat Spray may provide some temporary relief. ° ° ° ° °

## 2022-10-06 NOTE — ED Triage Notes (Signed)
Pt c/o left eye redness, itchiness and drainage that began yesterday. The pt also reports having a sore throat and postnasal drip.

## 2023-10-07 IMAGING — CT CT RENAL STONE PROTOCOL
2 of 4 series · 16 of 46 positions shown, 18 images · non-contrast
Comparison: None.

CLINICAL DATA: Flank pain, kidney stone suspected.



[Series 2: axial st · axial · 0.78mm/px · z∈[+1099,+1459]mm · 13 of 84 slices shown, 15 images]
[im 6/84  soft-tissue]
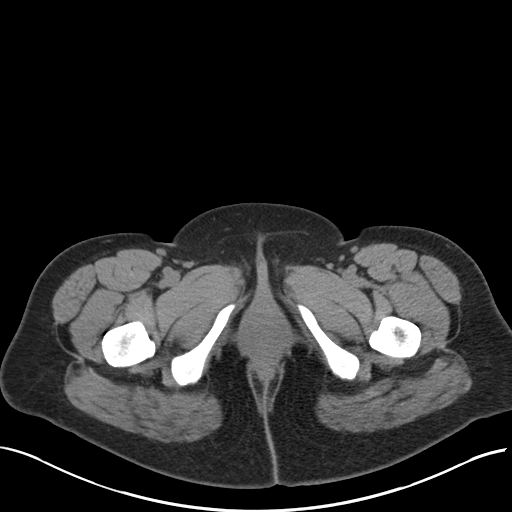
[im 6/84  bone]
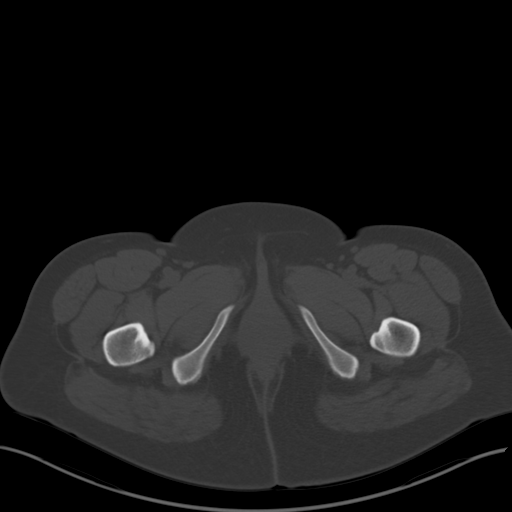
[im 11/84  soft-tissue]
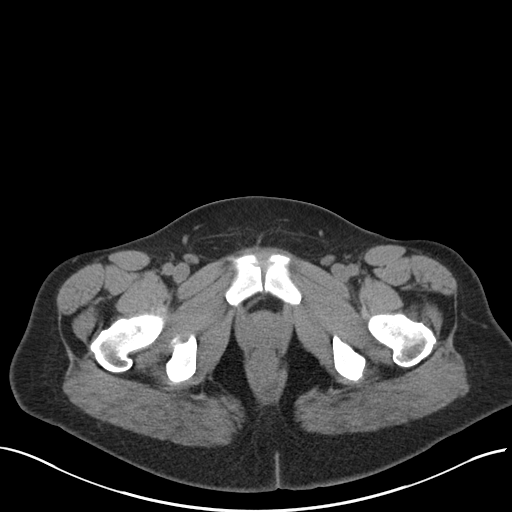
[im 16/84  soft-tissue]
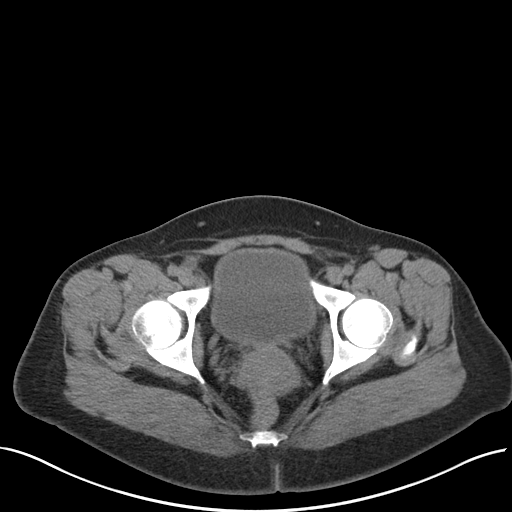
[im 26/84  soft-tissue]
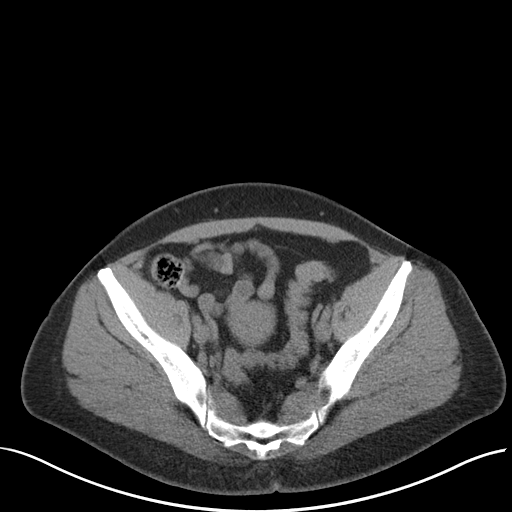
[im 32/84  soft-tissue]
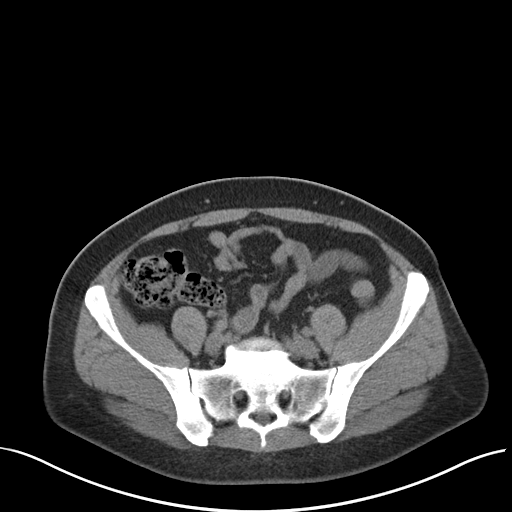
[im 37/84  soft-tissue]
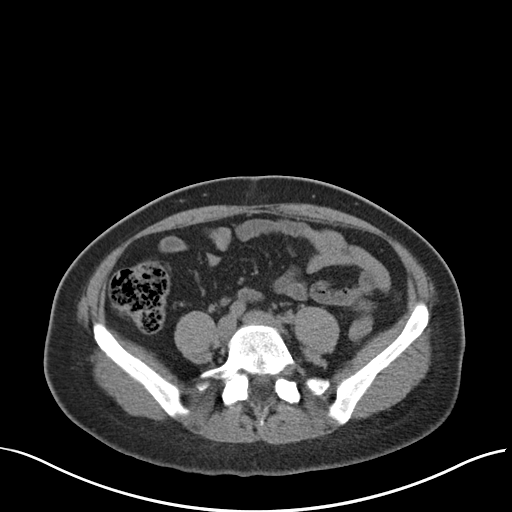
[im 42/84  soft-tissue]
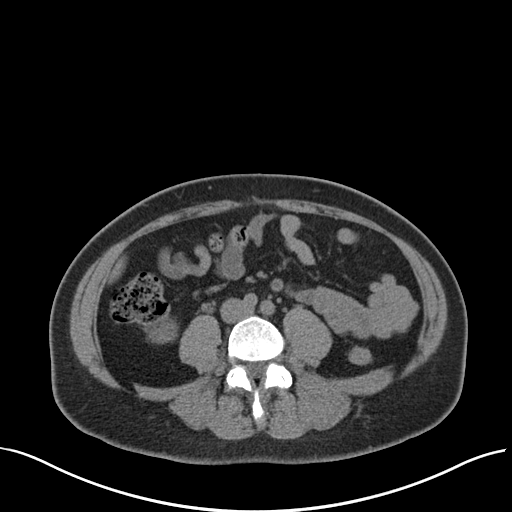
[im 47/84  soft-tissue]
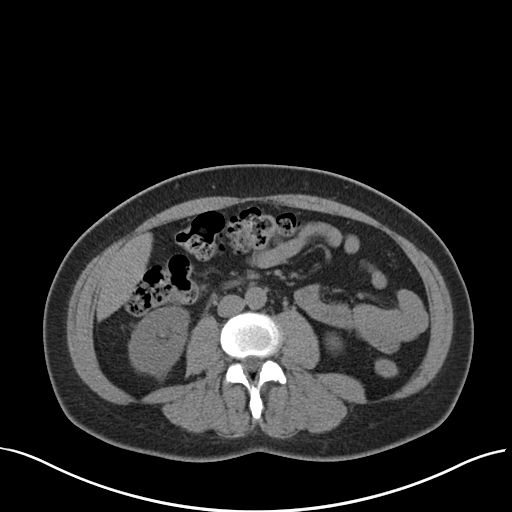
[im 52/84  soft-tissue]
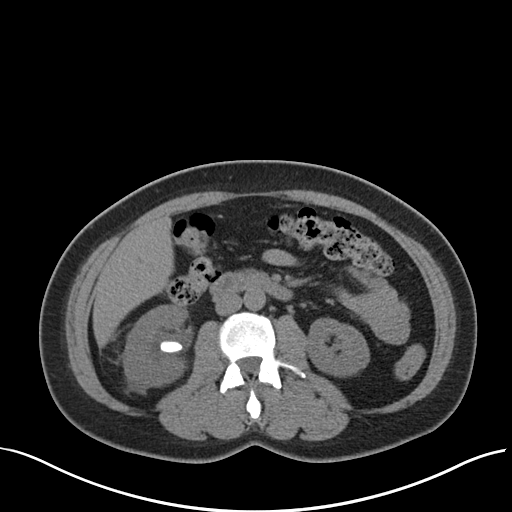
[im 52/84  bone]
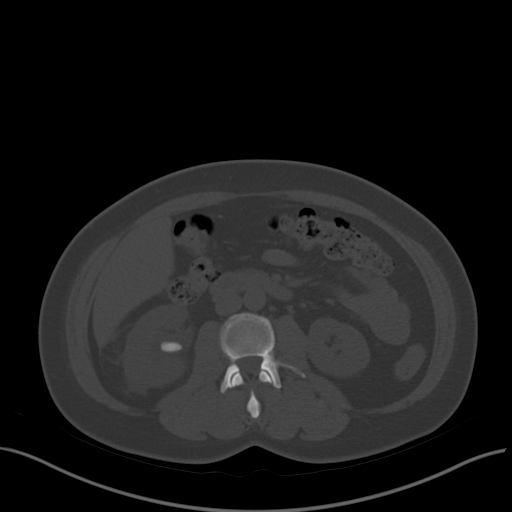
[im 58/84  soft-tissue]
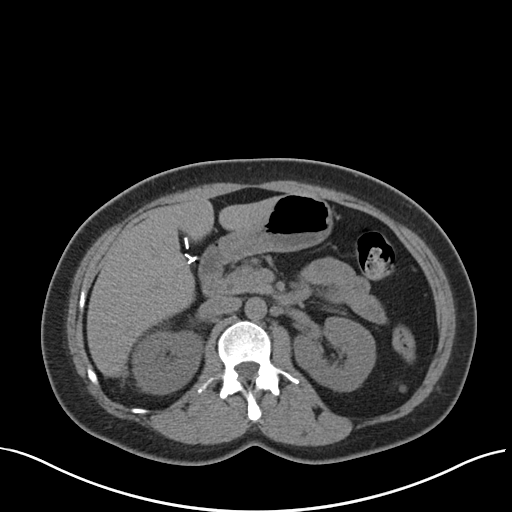
[im 68/84  soft-tissue]
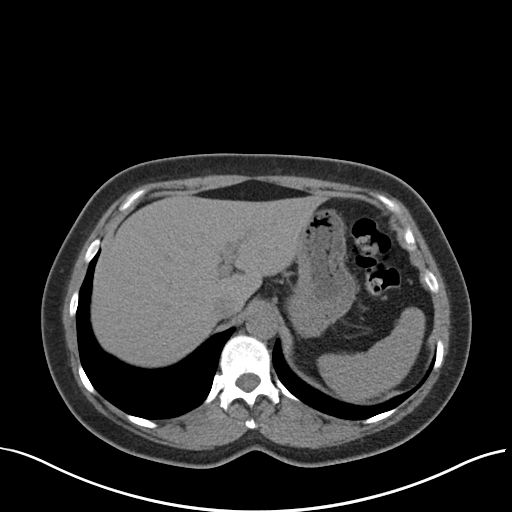
[im 73/84  soft-tissue]
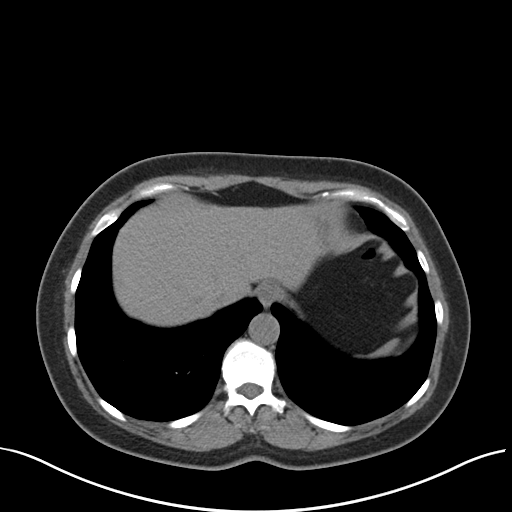
[im 78/84  soft-tissue]
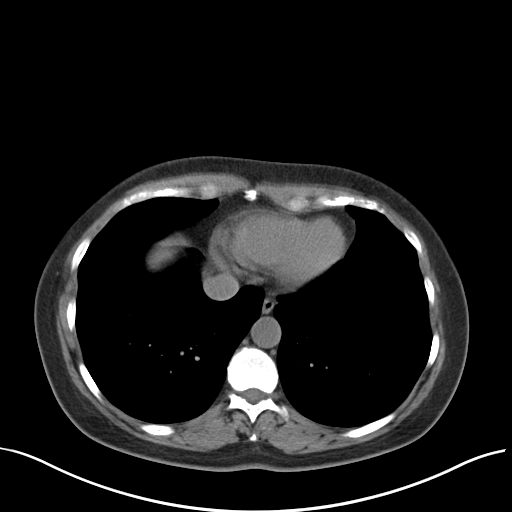

[Series 5: coronal · coronal · 0.89mm/px · 3 of 111 slices shown]
[im 37/111  soft-tissue]
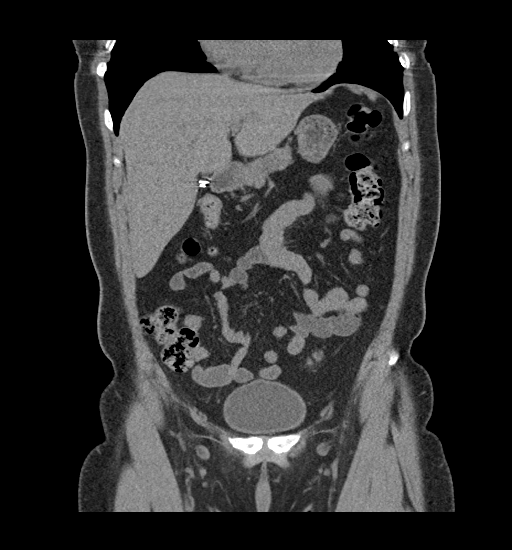
[im 49/111  soft-tissue]
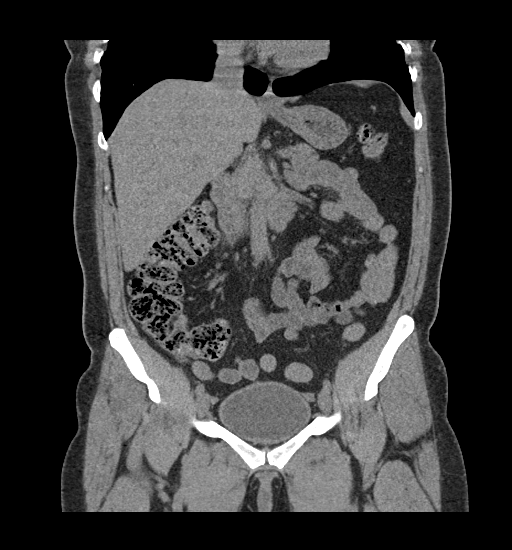
[im 62/111  soft-tissue]
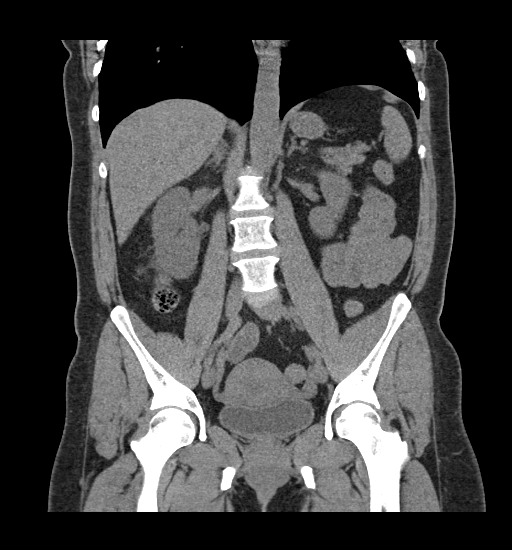

[16 of 46 positions shown; findings below may reference images not displayed]

FINDINGS: Lower chest: Lung bases are clear with some motion artifact.

Hepatobiliary: Normal appearance of the liver.  Cholecystectomy.

Pancreas: Unremarkable. No pancreatic ductal dilatation or
surrounding inflammatory changes.

Spleen: Normal in size without focal abnormality.

Adrenals/Urinary Tract: Normal adrenal glands. Normal appearance of
left kidney without stones or hydronephrosis. Mild right
hydronephrosis with right perinephric edema. Large right renal
pelvic stone that measures 1.6 x 0.8 x 1.2 cm. Additional small
stones in the right kidney lower pole. Mild dilatation of the right
ureter with a 3 mm stone in the distal right ureter just proximal to
the right ureterovesical junction. Normal appearance of the urinary
bladder.

Stomach/Bowel: No evidence for bowel dilatation or focal bowel
inflammation. Normal appearance of the stomach.

Vascular/Lymphatic: No significant vascular findings are present. No
enlarged abdominal or pelvic lymph nodes.

Reproductive: Uterus and bilateral adnexa are unremarkable.

Other: Negative for ascites. Negative for free air. Mild
irregularity along the anterior abdominal wall near the umbilicus
could be related to postoperative changes.

Musculoskeletal: Disc space narrowing at L5-S1. No acute bone
abnormality.
IMPRESSION: 1. Mild right hydroureteronephrosis with a 3 mm stone in the distal
right ureter. In addition, there are right renal calculi with a
large right renal pelvic stone measuring up to 1.6 cm. Right
perinephric edema.
